# Patient Record
Sex: Female | Born: 1955 | Race: White | Hispanic: No | Smoking: Former smoker
Health system: Southern US, Community
[De-identification: ages and names within clinical notes are randomized; demographics above are authoritative.]

## PROBLEM LIST (undated history)

## (undated) DIAGNOSIS — M2550 Pain in unspecified joint: Secondary | ICD-10-CM

## (undated) DIAGNOSIS — M1990 Unspecified osteoarthritis, unspecified site: Secondary | ICD-10-CM

## (undated) DIAGNOSIS — T7840XA Allergy, unspecified, initial encounter: Secondary | ICD-10-CM

## (undated) HISTORY — DX: Pain in unspecified joint: M25.50

## (undated) HISTORY — DX: Unspecified osteoarthritis, unspecified site: M19.90

## (undated) HISTORY — PX: ECTOPIC PREGNANCY SURGERY: SHX613

## (undated) HISTORY — PX: TONSILLECTOMY: SUR1361

## (undated) HISTORY — DX: Allergy, unspecified, initial encounter: T78.40XA

---

## 1998-05-07 ENCOUNTER — Other Ambulatory Visit: Admission: RE | Admit: 1998-05-07 | Discharge: 1998-05-07 | Payer: Self-pay | Admitting: Family Medicine

## 1999-05-16 ENCOUNTER — Other Ambulatory Visit: Admission: RE | Admit: 1999-05-16 | Discharge: 1999-05-16 | Payer: Self-pay | Admitting: Family Medicine

## 2000-07-23 ENCOUNTER — Other Ambulatory Visit: Admission: RE | Admit: 2000-07-23 | Discharge: 2000-07-23 | Payer: Self-pay | Admitting: Family Medicine

## 2001-07-23 ENCOUNTER — Other Ambulatory Visit: Admission: RE | Admit: 2001-07-23 | Discharge: 2001-07-23 | Payer: Self-pay | Admitting: Family Medicine

## 2002-01-30 ENCOUNTER — Encounter: Admission: RE | Admit: 2002-01-30 | Discharge: 2002-01-30 | Payer: Self-pay | Admitting: Family Medicine

## 2002-01-30 ENCOUNTER — Encounter: Payer: Self-pay | Admitting: Family Medicine

## 2003-01-23 ENCOUNTER — Other Ambulatory Visit: Admission: RE | Admit: 2003-01-23 | Discharge: 2003-01-23 | Payer: Self-pay | Admitting: Family Medicine

## 2011-05-11 ENCOUNTER — Ambulatory Visit: Payer: Self-pay | Admitting: Physician Assistant

## 2011-05-11 VITALS — BP 130/78 | HR 85 | Temp 98.1°F | Resp 16 | Ht 65.78 in | Wt 169.0 lb

## 2011-05-11 DIAGNOSIS — S91309A Unspecified open wound, unspecified foot, initial encounter: Secondary | ICD-10-CM

## 2011-05-11 DIAGNOSIS — S91339A Puncture wound without foreign body, unspecified foot, initial encounter: Secondary | ICD-10-CM

## 2011-05-11 MED ORDER — CIPROFLOXACIN HCL 500 MG PO TABS: 500.0000 mg | ORAL_TABLET | Freq: Two times a day (BID) | ORAL | Status: DC

## 2011-05-11 MED ORDER — TETANUS-DIPHTH-ACELL PERTUSSIS 5-2.5-18.5 LF-MCG/0.5 IM SUSP: 0.5000 mL | Freq: Once | INTRAMUSCULAR | Status: DC

## 2011-05-11 NOTE — Progress Notes (Signed)
Patient ID: Ashlee Cochran MRN: [id number], DOB: December 23, 1955, 56 y.o. Date of Encounter: 05/11/2011, 5:49 PM  Primary Physician: No primary provider on file.  Chief Complaint: Puncture wound to right foot  HPI: 56 y.o. year old female present with puncture wound to the right plantar foot. Patient was taking down her current deck and went to kick a board loose. Upon doing do she inadvertently kicked where a nail was causing the nail to go through her tennis shoe and sock into the plantar surface of her foot. After her injury she washed the wound with soap and water. She examined the nail and saw it in total stating that a piece did not break off into her. She is uncertain of when her last tetanus vaccine was. Mild discomfort with ambulation.    No past medical history on file.   Home Meds: Prior to Admission medications   Medication Sig Start Date End Date Taking? Authorizing Provider           Allergies:  Allergies  Allergen Reactions  . Codeine Nausea And Vomiting    History   Social History  . Marital Status: Widowed    Spouse Name: N/A    Number of Children: N/A  . Years of Education: N/A   Occupational History  . Not on file.   Social History Main Topics  . Smoking status: Former Smoker    Quit date: 05/11/2002  . Smokeless tobacco: Not on file  . Alcohol Use: Not on file  . Drug Use: Not on file  . Sexually Active: Not on file   Other Topics Concern  . Not on file   Social History Narrative  . No narrative on file     Review of Systems: Constitutional: negative for chills, fever, night sweats, weight changes, or fatigue  HEENT: negative for vision changes, hearing loss, congestion, rhinorrhea, ST, epistaxis, or sinus pressure Cardiovascular: negative for chest pain or palpitations Respiratory: negative for hemoptysis, wheezing, shortness of breath, or cough Abdominal: negative for abdominal pain, nausea, vomiting, diarrhea, or constipation Dermatological:  negative for rash Neurologic: negative for headache, dizziness, or syncope All other systems reviewed and are otherwise negative with the exception to those above and in the HPI.   Physical Exam: Blood pressure 130/78, pulse 85, temperature 98.1 F (36.7 C), temperature source Oral, resp. rate 16, height 5' 5.78" (1.671 m), weight 169 lb (76.658 kg), last menstrual period 04/05/2011., Body mass index is 27.46 kg/(m^2). General: Well developed, well nourished, in no acute distress. Head: Normocephalic, atraumatic, eyes without discharge, sclera non-icteric, nares are without discharge.   Neck: Supple. No thyromegaly. Full ROM. No lymphadenopathy. Lungs: Clear bilaterally to auscultation without wheezes, rales, or rhonchi. Breathing is unlabored. Heart: RRR with S1 S2. No murmurs, rubs, or gallops appreciated. Msk:  Strength and tone normal for age. Extremities/Skin: Right plantar surface of the foot with small puncture wound to lateral aspect. Mild TTP. No erythema or STS. No FB visualized. No purulence expressed. Warm and dry. No clubbing or cyanosis. No edema. No rashes or suspicious lesions. Neuro: Alert and oriented X 3. Moves all extremities spontaneously. Gait is normal. CNII-XII grossly in tact. Psych:  Responds to questions appropriately with a normal affect.   Xrays: Declined by the patient  ASSESSMENT AND PLAN:  56 y.o. year old female with puncture wound to the plantar surface of the right foot -Washed with soap and water -Dressed -Cipro 500 mg #20 1 po bid no RF -TDaP -Xrays declined to  r/o possible FB. Patient informed RTC precautions   Signed, Eula Listen, PA-C 05/11/2011 5:49 PM

## 2011-05-12 ENCOUNTER — Other Ambulatory Visit: Payer: Self-pay | Admitting: *Deleted

## 2011-05-12 DIAGNOSIS — S91339A Puncture wound without foreign body, unspecified foot, initial encounter: Secondary | ICD-10-CM

## 2011-05-12 MED ORDER — CIPROFLOXACIN HCL 500 MG PO TABS: 500.0000 mg | ORAL_TABLET | Freq: Two times a day (BID) | ORAL | Status: AC

## 2012-12-01 ENCOUNTER — Ambulatory Visit: Payer: Managed Care, Other (non HMO)

## 2012-12-01 ENCOUNTER — Ambulatory Visit (INDEPENDENT_AMBULATORY_CARE_PROVIDER_SITE_OTHER): Payer: Managed Care, Other (non HMO) | Admitting: Internal Medicine

## 2012-12-01 VITALS — BP 116/72 | HR 69 | Temp 97.8°F | Resp 18 | Ht 67.5 in | Wt 170.0 lb

## 2012-12-01 DIAGNOSIS — M722 Plantar fascial fibromatosis: Secondary | ICD-10-CM

## 2012-12-01 DIAGNOSIS — M659 Synovitis and tenosynovitis, unspecified: Secondary | ICD-10-CM

## 2012-12-01 DIAGNOSIS — M25571 Pain in right ankle and joints of right foot: Secondary | ICD-10-CM

## 2012-12-01 DIAGNOSIS — M7750 Other enthesopathy of unspecified foot: Secondary | ICD-10-CM

## 2012-12-01 DIAGNOSIS — M25579 Pain in unspecified ankle and joints of unspecified foot: Secondary | ICD-10-CM

## 2012-12-01 MED ORDER — MELOXICAM 15 MG PO TABS: 15.0000 mg | ORAL_TABLET | Freq: Every day | ORAL | Status: DC

## 2012-12-01 NOTE — Patient Instructions (Signed)
Plantar Fasciitis (Heel Spur Syndrome)  with Rehab  The plantar fascia is a fibrous, ligament-like, soft-tissue structure that spans the bottom of the foot. Plantar fasciitis is a condition that causes pain in the foot due to inflammation of the tissue.  SYMPTOMS   · Pain and tenderness on the underneath side of the foot.  · Pain that worsens with standing or walking.  CAUSES   Plantar fasciitis is caused by irritation and injury to the plantar fascia on the underneath side of the foot. Common mechanisms of injury include:  · Direct trauma to bottom of the foot.  · Damage to a small nerve that runs under the foot where the main fascia attaches to the heel bone.  · Stress placed on the plantar fascia due to bone spurs.  RISK INCREASES WITH:   · Activities that place stress on the plantar fascia (running, jumping, pivoting, or cutting).  · Poor strength and flexibility.  · Improperly fitted shoes.  · Tight calf muscles.  · Flat feet.  · Failure to warm-up properly before activity.  · Obesity.  PREVENTION  · Warm up and stretch properly before activity.  · Allow for adequate recovery between workouts.  · Maintain physical fitness:  · Strength, flexibility, and endurance.  · Cardiovascular fitness.  · Maintain a health body weight.  · Avoid stress on the plantar fascia.  · Wear properly fitted shoes, including arch supports for individuals who have flat feet.  PROGNOSIS   If treated properly, then the symptoms of plantar fasciitis usually resolve without surgery. However, occasionally surgery is necessary.  RELATED COMPLICATIONS   · Recurrent symptoms that may result in a chronic condition.  · Problems of the lower back that are caused by compensating for the injury, such as limping.  · Pain or weakness of the foot during push-off following surgery.  · Chronic inflammation, scarring, and partial or complete fascia tear, occurring more often from repeated injections.  TREATMENT   Treatment initially involves the use of  ice and medication to help reduce pain and inflammation. The use of strengthening and stretching exercises may help reduce pain with activity, especially stretches of the Achilles tendon. These exercises may be performed at home or with a therapist. Your caregiver may recommend that you use heel cups of arch supports to help reduce stress on the plantar fascia. Occasionally, corticosteroid injections are given to reduce inflammation. If symptoms persist for greater than 6 months despite non-surgical (conservative), then surgery may be recommended.   MEDICATION   · If pain medication is necessary, then nonsteroidal anti-inflammatory medications, such as aspirin and ibuprofen, or other minor pain relievers, such as acetaminophen, are often recommended.  · Do not take pain medication within 7 days before surgery.  · Prescription pain relievers may be given if deemed necessary by your caregiver. Use only as directed and only as much as you need.  · Corticosteroid injections may be given by your caregiver. These injections should be reserved for the most serious cases, because they may only be given a certain number of times.  HEAT AND COLD  · Cold treatment (icing) relieves pain and reduces inflammation. Cold treatment should be applied for 10 to 15 minutes every 2 to 3 hours for inflammation and pain and immediately after any activity that aggravates your symptoms. Use ice packs or massage the area with a piece of ice (ice massage).  · Heat treatment may be used prior to performing the stretching and strengthening activities prescribed   by your caregiver, physical therapist, or athletic trainer. Use a heat pack or soak the injury in warm water.  SEEK IMMEDIATE MEDICAL CARE IF:  · Treatment seems to offer no benefit, or the condition worsens.  · Any medications produce adverse side effects.  EXERCISES  RANGE OF MOTION (ROM) AND STRETCHING EXERCISES - Plantar Fasciitis (Heel Spur Syndrome)  These exercises may help you  when beginning to rehabilitate your injury. Your symptoms may resolve with or without further involvement from your physician, physical therapist or athletic trainer. While completing these exercises, remember:   · Restoring tissue flexibility helps normal motion to return to the joints. This allows healthier, less painful movement and activity.  · An effective stretch should be held for at least 30 seconds.  · A stretch should never be painful. You should only feel a gentle lengthening or release in the stretched tissue.  RANGE OF MOTION - Toe Extension, Flexion  · Sit with your right / left leg crossed over your opposite knee.  · Grasp your toes and gently pull them back toward the top of your foot. You should feel a stretch on the bottom of your toes and/or foot.  · Hold this stretch for __________ seconds.  · Now, gently pull your toes toward the bottom of your foot. You should feel a stretch on the top of your toes and or foot.  · Hold this stretch for __________ seconds.  Repeat __________ times. Complete this stretch __________ times per day.   RANGE OF MOTION - Ankle Dorsiflexion, Active Assisted  · Remove shoes and sit on a chair that is preferably not on a carpeted surface.  · Place right / left foot under knee. Extend your opposite leg for support.  · Keeping your heel down, slide your right / left foot back toward the chair until you feel a stretch at your ankle or calf. If you do not feel a stretch, slide your bottom forward to the edge of the chair, while still keeping your heel down.  · Hold this stretch for __________ seconds.  Repeat __________ times. Complete this stretch __________ times per day.   STRETCH  Gastroc, Standing  · Place hands on wall.  · Extend right / left leg, keeping the front knee somewhat bent.  · Slightly point your toes inward on your back foot.  · Keeping your right / left heel on the floor and your knee straight, shift your weight toward the wall, not allowing your back to  arch.  · You should feel a gentle stretch in the right / left calf. Hold this position for __________ seconds.  Repeat __________ times. Complete this stretch __________ times per day.  STRETCH  Soleus, Standing  · Place hands on wall.  · Extend right / left leg, keeping the other knee somewhat bent.  · Slightly point your toes inward on your back foot.  · Keep your right / left heel on the floor, bend your back knee, and slightly shift your weight over the back leg so that you feel a gentle stretch deep in your back calf.  · Hold this position for __________ seconds.  Repeat __________ times. Complete this stretch __________ times per day.  STRETCH  Gastrocsoleus, Standing   Note: This exercise can place a lot of stress on your foot and ankle. Please complete this exercise only if specifically instructed by your caregiver.   · Place the ball of your right / left foot on a step, keeping   your other foot firmly on the same step.  · Hold on to the wall or a rail for balance.  · Slowly lift your other foot, allowing your body weight to press your heel down over the edge of the step.  · You should feel a stretch in your right / left calf.  · Hold this position for __________ seconds.  · Repeat this exercise with a slight bend in your right / left knee.  Repeat __________ times. Complete this stretch __________ times per day.   STRENGTHENING EXERCISES - Plantar Fasciitis (Heel Spur Syndrome)   These exercises may help you when beginning to rehabilitate your injury. They may resolve your symptoms with or without further involvement from your physician, physical therapist or athletic trainer. While completing these exercises, remember:   · Muscles can gain both the endurance and the strength needed for everyday activities through controlled exercises.  · Complete these exercises as instructed by your physician, physical therapist or athletic trainer. Progress the resistance and repetitions only as guided.  STRENGTH - Towel  Curls  · Sit in a chair positioned on a non-carpeted surface.  · Place your foot on a towel, keeping your heel on the floor.  · Pull the towel toward your heel by only curling your toes. Keep your heel on the floor.  · If instructed by your physician, physical therapist or athletic trainer, add ____________________ at the end of the towel.  Repeat __________ times. Complete this exercise __________ times per day.  STRENGTH - Ankle Inversion  · Secure one end of a rubber exercise band/tubing to a fixed object (table, pole). Loop the other end around your foot just before your toes.  · Place your fists between your knees. This will focus your strengthening at your ankle.  · Slowly, pull your big toe up and in, making sure the band/tubing is positioned to resist the entire motion.  · Hold this position for __________ seconds.  · Have your muscles resist the band/tubing as it slowly pulls your foot back to the starting position.  Repeat __________ times. Complete this exercises __________ times per day.   Document Released: 12/19/2004 Document Revised: 03/13/2011 Document Reviewed: 04/02/2008  [employer]® Patient Information ©2014 [employer].

## 2012-12-01 NOTE — Progress Notes (Addendum)
Subjective:    Patient ID: Ashlee Cochran, female    DOB: 1955/01/12, 57 y.o.   MRN: [id number]  This chart was scribed for Ashlee Sia, MD by Greggory Stallion, Medical Scribe. This patient's care was started at 11:10 AM.  HPI HPI Comments: Ashlee Cochran is a 57 y.o. female who presents to the office complaining of gradual onset, worsening right foot pain that started several months ago. The pain is mainly on the plantar aspect of her foot. Pt states there has also been some discoloration on her right ankle and foot. She states she might have injured it while kayaking over the summer. She states it was never swollen after the subjective injury. Pt states it is now starting to bother her at night and keeps her awake. Bearing weight can worsen the pain.  See prior nail wound R foot There are no active problems to display for this patient.  History reviewed. No pertinent past medical history. History reviewed. No pertinent past surgical history. Allergies  Allergen Reactions  . Codeine Nausea And Vomiting   Prior to Admission medications   Not on File   History   Social History  . Marital Status: Widowed    Spouse Name: N/A    Number of Children: N/A  . Years of Education: N/A   Occupational History  . Not on file.   Social History Main Topics  . Smoking status: Former Smoker    Quit date: 05/11/2002  . Smokeless tobacco: Not on file  . Alcohol Use: Not on file  . Drug Use: Not on file  . Sexual Activity: Not on file   Other Topics Concern  . Not on file   Social History Narrative  . No narrative on file    Review of Systems  Constitutional: Negative for fever.  Musculoskeletal: Positive for arthralgias and myalgias.  Skin: Positive for color change.       Objective:   Physical Exam  Nursing note and vitals reviewed. Constitutional: She is oriented to person, place, and time. She appears well-developed and well-nourished. No distress.  HENT:  Head:  Normocephalic and atraumatic.  Eyes: EOM are normal.  Neck: Neck supple.  Cardiovascular: Normal rate.   Pulmonary/Chest: Effort normal. No respiratory distress.  Musculoskeletal: Normal range of motion.  Left ankle has full ROM but there is ecchymosis medially behind medial malleolus without obvious swelling or crepitus. Tender at first M-T junction. Pain with eversion. Tender at the insertion of plantar fascia. Tendon nodule mid arch non tender. Calf is normal.   Neurological: She is alert and oriented to person, place, and time.  Skin: Skin is warm and dry.  Psychiatric: She has a normal mood and affect. Her behavior is normal.  [facility] reading (PRIMARY) by  Dr. Lucciano Vitali=negative ankle    Filed Vitals:   12/01/12 1022  BP: 116/72  Pulse: 69  Temp: 97.8 F (36.6 C)  TempSrc: Oral  Resp: 18  Height: 5' 7.5" (1.715 m)  Weight: 170 lb (77.111 kg)  SpO2: 100%      Assessment & Plan:  11:14 AM-Discussed treatment plan which includes ankle xray with pt at bedside and pt agreed to plan.   I have completed the patient encounter in its entirety as documented by the scribe, with editing by me where necessary. Taz Vanness P. Merla Riches, M.D.  Pain in joint, ankle and foot, right - Plan: DG Ankle Complete Right plantar fasc Tendonitis MT-T #1 Tendon nodule-plantar  melox plus exercises If not well 10mo  to orthe

## 2012-12-28 ENCOUNTER — Ambulatory Visit (INDEPENDENT_AMBULATORY_CARE_PROVIDER_SITE_OTHER): Payer: Managed Care, Other (non HMO) | Admitting: Internal Medicine

## 2012-12-28 ENCOUNTER — Ambulatory Visit: Payer: Self-pay

## 2012-12-28 ENCOUNTER — Ambulatory Visit: Payer: Managed Care, Other (non HMO)

## 2012-12-28 VITALS — BP 128/78 | HR 64 | Temp 98.0°F | Resp 16 | Ht 67.5 in | Wt 168.2 lb

## 2012-12-28 DIAGNOSIS — M25512 Pain in left shoulder: Secondary | ICD-10-CM

## 2012-12-28 DIAGNOSIS — W19XXXA Unspecified fall, initial encounter: Secondary | ICD-10-CM

## 2012-12-28 DIAGNOSIS — S42209A Unspecified fracture of upper end of unspecified humerus, initial encounter for closed fracture: Secondary | ICD-10-CM

## 2012-12-28 DIAGNOSIS — M25519 Pain in unspecified shoulder: Secondary | ICD-10-CM

## 2012-12-28 MED ORDER — HYDROCODONE-ACETAMINOPHEN 5-325 MG PO TABS: 1.0000 | ORAL_TABLET | Freq: Four times a day (QID) | ORAL | Status: DC | PRN

## 2012-12-28 NOTE — Progress Notes (Addendum)
Subjective:  This chart was scribed for Ashlee Sia, MD by Carl Best, Medical Scribe. This patient was seen in Room 10 and the patient's care was started at 4:34 PM.   Patient ID: Ashlee Cochran, female    DOB: [birth year], 57 y.o.   MRN: [id number]  HPI HPI Comments: Ashlee Cochran is a 57 y.o. female who presents to the [facility] complaining of constant left shoulder pain radiating to her left wrist, left elbow, and left ribs that started today after the patient fell while roller bladding.  She states that she fell onto some grass and on her left shoulder.  She states that she was wearing her helmet at the time of the incident but was not wearing wrist guards.  The patient states that her left ribs pain is aggravated when she takes a deep breath.  History reviewed. No pertinent past medical history. History reviewed. No pertinent past surgical history.. Family History  Problem Relation Age of Onset  . Cancer Mother   . Cancer Father    History   Social History  . Marital Status: Widowed    Spouse Name: N/A    Number of Children: N/A  . Years of Education: N/A   Occupational History  . Not on file.   Social History Main Topics  . Smoking status: Former Smoker    Quit date: 05/11/2002  . Smokeless tobacco: Not on file  . Alcohol Use: Not on file  . Drug Use: Not on file  . Sexual Activity: Not on file   Other Topics Concern  . Not on file   Social History Narrative  . No narrative on file   Allergies  Allergen Reactions  . Codeine Nausea And Vomiting      Review of Systems A complete 10 system review of systems was obtained and all systems are negative except as noted in the HPI and PMH.   Objective:  Physical Exam  Nursing note and vitals reviewed. Constitutional: She is oriented to person, place, and time. She appears well-developed and well-nourished. No distress.  HENT:  Head: Normocephalic and atraumatic.  Eyes:  Conjunctivae and EOM are normal. Pupils are equal, round, and reactive to light.  Neck: Neck supple.  Cardiovascular: Normal rate and regular rhythm.   Pulmonary/Chest: Effort normal and breath sounds normal. No respiratory distress. She has no wheezes. She exhibits tenderness.  Very tender over the ribs in the axillary line  Abdominal: She exhibits no distension. There is no tenderness.  Musculoskeletal:       Left shoulder: She exhibits no deformity.  Very tender over the upper portion of the humerus with pain with any range of motion of the shoulder Nontender over the clavicle and a.c. Joint Tender over the trapezius Tender over the deltoid Not very swollen/no ecchymoses No distal motor or sensory losses Neck exam is normal Thoracic spine normal  Left wrist and hand normal   Neurological: She is alert and oriented to person, place, and time. No cranial nerve deficit.  Psychiatric: She has a normal mood and affect. Her behavior is normal.    [facility] reading (PRIMARY) by  Dr.Caron Ode=?nondipl fx prox humerus  No evidence of rib fractures  No evidence of fractures around the shoulder    BP 128/78  Pulse 64  Temp(Src) 98 F (36.7 C) (Oral)  Resp 16  Ht 5' 7.5" (1.715 m)  Wt 168 lb 3.2 oz (76.295 kg)  BMI 25.94 kg/m2  SpO2 99%  Assessment & Plan:  Fall, initial encounter - Plan: DG Humerus Left, DG Shoulder Left, DG Chest 1 View  Pain in shoulder region, left - Plan: DG Shoulder Left, DG Chest 1 View  Closed fracture of unspecified part of upper end of humerus - Plan: AMB referral to orthopedics  She will remain in a sling and be followed by referral to [facility] at the first of the week Meds ordered this encounter  Medications  . cholecalciferol (VITAMIN D) 400 UNITS TABS tablet    Sig: Take 400 Units by mouth.  Marland Kitchen HYDROcodone-acetaminophen (NORCO) 5-325 MG per tablet    Sig: Take 1 tablet by mouth every 6 (six) hours as needed for moderate pain.     Dispense:  30 tablet    Refill:  0    I personally performed the services described in this documentation, which was scribed in my presence. The recorded information has been reviewed and is accurate.

## 2012-12-31 ENCOUNTER — Encounter: Payer: Self-pay | Admitting: Physician Assistant

## 2013-01-23 ENCOUNTER — Encounter: Payer: Managed Care, Other (non HMO) | Admitting: Physician Assistant

## 2013-02-27 ENCOUNTER — Encounter: Payer: Managed Care, Other (non HMO) | Admitting: Physician Assistant

## 2013-03-25 ENCOUNTER — Encounter: Payer: Managed Care, Other (non HMO) | Admitting: Physician Assistant

## 2013-04-17 ENCOUNTER — Encounter: Payer: Self-pay | Admitting: Physician Assistant

## 2013-04-17 ENCOUNTER — Ambulatory Visit (INDEPENDENT_AMBULATORY_CARE_PROVIDER_SITE_OTHER): Payer: Managed Care, Other (non HMO) | Admitting: Physician Assistant

## 2013-04-17 VITALS — BP 121/82 | HR 69 | Temp 97.8°F | Resp 16 | Ht 66.0 in | Wt 166.0 lb

## 2013-04-17 DIAGNOSIS — Z1211 Encounter for screening for malignant neoplasm of colon: Secondary | ICD-10-CM

## 2013-04-17 DIAGNOSIS — Z1159 Encounter for screening for other viral diseases: Secondary | ICD-10-CM

## 2013-04-17 DIAGNOSIS — Z0000 Encounter for general adult medical examination without abnormal findings: Secondary | ICD-10-CM

## 2013-04-17 DIAGNOSIS — R195 Other fecal abnormalities: Secondary | ICD-10-CM

## 2013-04-17 DIAGNOSIS — Z23 Encounter for immunization: Secondary | ICD-10-CM

## 2013-04-17 DIAGNOSIS — Z124 Encounter for screening for malignant neoplasm of cervix: Secondary | ICD-10-CM

## 2013-04-17 LAB — POCT URINALYSIS DIPSTICK
BILIRUBIN UA: NEGATIVE
GLUCOSE UA: NEGATIVE
Ketones, UA: NEGATIVE
NITRITE UA: NEGATIVE
Protein, UA: NEGATIVE
RBC UA: NEGATIVE
Spec Grav, UA: 1.015
Urobilinogen, UA: 0.2
pH, UA: 7.5

## 2013-04-17 LAB — COMPREHENSIVE METABOLIC PANEL
ALT: 22 U/L (ref 0–35)
AST: 20 U/L (ref 0–37)
Albumin: 4.7 g/dL (ref 3.5–5.2)
Alkaline Phosphatase: 66 U/L (ref 39–117)
BUN: 11 mg/dL (ref 6–23)
CALCIUM: 10 mg/dL (ref 8.4–10.5)
CHLORIDE: 104 meq/L (ref 96–112)
CO2: 30 mEq/L (ref 19–32)
CREATININE: 0.7 mg/dL (ref 0.50–1.10)
Glucose, Bld: 98 mg/dL (ref 70–99)
Potassium: 4.7 mEq/L (ref 3.5–5.3)
Sodium: 143 mEq/L (ref 135–145)
Total Bilirubin: 0.5 mg/dL (ref 0.2–1.2)
Total Protein: 7.4 g/dL (ref 6.0–8.3)

## 2013-04-17 LAB — CBC WITH DIFFERENTIAL/PLATELET
Basophils Absolute: 0.1 10*3/uL (ref 0.0–0.1)
Basophils Relative: 1 % (ref 0–1)
Eosinophils Absolute: 0.1 10*3/uL (ref 0.0–0.7)
Eosinophils Relative: 2 % (ref 0–5)
HEMATOCRIT: 45.9 % (ref 36.0–46.0)
HEMOGLOBIN: 16.1 g/dL — AB (ref 12.0–15.0)
LYMPHS ABS: 2.4 10*3/uL (ref 0.7–4.0)
LYMPHS PCT: 46 % (ref 12–46)
MCH: 31.3 pg (ref 26.0–34.0)
MCHC: 35.1 g/dL (ref 30.0–36.0)
MCV: 89.1 fL (ref 78.0–100.0)
MONO ABS: 0.4 10*3/uL (ref 0.1–1.0)
Monocytes Relative: 8 % (ref 3–12)
Neutro Abs: 2.3 10*3/uL (ref 1.7–7.7)
Neutrophils Relative %: 43 % (ref 43–77)
Platelets: 205 10*3/uL (ref 150–400)
RBC: 5.15 MIL/uL — ABNORMAL HIGH (ref 3.87–5.11)
RDW: 12.9 % (ref 11.5–15.5)
WBC: 5.3 10*3/uL (ref 4.0–10.5)

## 2013-04-17 LAB — TSH: TSH: 2.266 u[IU]/mL (ref 0.350–4.500)

## 2013-04-17 LAB — LIPID PANEL
Cholesterol: 274 mg/dL — ABNORMAL HIGH (ref 0–200)
HDL: 63 mg/dL (ref >39)
LDL Cholesterol: 183 mg/dL — ABNORMAL HIGH (ref 0–99)
Total CHOL/HDL Ratio: 4.3 Ratio
Triglycerides: 141 mg/dL (ref <150)
VLDL: 28 mg/dL (ref 0–40)

## 2013-04-17 LAB — POCT UA - MICROSCOPIC ONLY
Amorphous: POSITIVE
Casts, Ur, LPF, POC: NEGATIVE
Crystals, Ur, HPF, POC: NEGATIVE
Mucus, UA: POSITIVE
Yeast, UA: NEGATIVE

## 2013-04-17 LAB — IFOBT (OCCULT BLOOD): IFOBT: POSITIVE

## 2013-04-17 MED ORDER — ZOSTER VACCINE LIVE 19400 UNT/0.65ML ~~LOC~~ SOLR: 0.6500 mL | Freq: Once | SUBCUTANEOUS | Status: DC

## 2013-04-17 NOTE — Patient Instructions (Signed)
I will contact you with your lab results as soon as they are available.   If you have not heard from me in 2 weeks, please contact me.  The fastest way to get your results is to register for My Chart (see the instructions on the last page of this printout).  Keeping You Healthy  Get These Tests  Blood Pressure- Have your blood pressure checked by your healthcare provider at least once a year.  Normal blood pressure is 120/80.  Weight- Have your body mass index (BMI) calculated to screen for obesity.  BMI is a measure of body fat based on height and weight.  You can calculate your own BMI at www.nhlbisupport.com/bmi/  Cholesterol- Have your cholesterol checked every year.  Diabetes- Have your blood sugar checked every year if you have high blood pressure, high cholesterol, a family history of diabetes or if you are overweight.  Pap Smear- Have a pap smear every 3 to 5 years if you have been sexually active.  If you are older than 65 and recent pap smears have been normal you may not need additional pap smears.  In addition, if you have had a hysterectomy  For benign disease additional pap smears are not necessary.  Mammogram-Yearly mammograms are essential for early detection of breast cancer  Screening for Colon Cancer- Colonoscopy starting at age 50. Screening may begin sooner depending on your family history and other health conditions.  Follow up colonoscopy as directed by your Gastroenterologist.  Screening for Osteoporosis- Screening begins at age 65 with bone density scanning, sooner if you are at higher risk for developing Osteoporosis.  Get these medicines  Calcium with Vitamin D- Your body requires 1200-1500 mg of Calcium a day and 800-1000 IU of Vitamin D a day.  You can only absorb 500 mg of Calcium at a time therefore Calcium must be taken in 2 or 3 separate doses throughout the day.  Hormones- Hormone therapy has been associated with increased risk for certain cancers and  heart disease.  Talk to your healthcare provider about if you need relief from menopausal symptoms.  Aspirin- Ask your healthcare provider about taking Aspirin to prevent Heart Disease and Stroke.  Get these Immuniztions  Flu shot- Every fall  Pneumonia shot- Once after the age of 65; if you are younger ask your healthcare provider if you need a pneumonia shot.  Tetanus- Every ten years.  Zostavax- Once after the age of 60 to prevent shingles.  Take these steps  Don't smoke- Your healthcare provider can help you quit. For tips on how to quit, ask your healthcare provider or go to www.smokefree.gov or call 1-800 QUIT-NOW.  Be physically active- Exercise 5 days a week for a minimum of 30 minutes.  If you are not already physically active, start slow and gradually work up to 30 minutes of moderate physical activity.  Try walking, dancing, bike riding, swimming, etc.  Eat a healthy diet- Eat a variety of healthy foods such as fruits, vegetables, whole grains, low fat milk, low fat cheeses, yogurt, lean meats, chicken, fish, eggs, dried beans, tofu, etc.  For more information go to www.thenutritionsource.org  Dental visit- Brush and floss teeth twice daily; visit your dentist twice a year.  Eye exam- Visit your Optometrist or Ophthalmologist yearly.  Drink alcohol in moderation- Limit alcohol intake to one drink or less a day.  Never drink and drive.  Depression- Your emotional health is as important as your physical health.  If you're feeling   down or losing interest in things you normally enjoy, please talk to your healthcare provider.  Seat Belts- can save your life; always wear one  Smoke/Carbon Monoxide detectors- These detectors need to be installed on the appropriate level of your home.  Replace batteries at least once a year.  Violence- If anyone is threatening or hurting you, please tell your healthcare provider.  Living Will/ Health care power of attorney- Discuss with your  healthcare provider and family. 

## 2013-04-17 NOTE — Progress Notes (Signed)
Subjective:    Patient ID: Ashlee Cochran, female    DOB: 07-Mar-1955, 58 y.o.   MRN: [id number]   PCP: No PCP Per Patient  Chief Complaint  Patient presents with  . Annual Exam  . Gynecologic Exam    HPI    Review of Systems  Constitutional: Positive for diaphoresis (since menses stopped spring 2014; now less frequent, and only at night). Negative for fever, chills, activity change, appetite change, fatigue and unexpected weight change.  HENT: Negative.   Eyes: Negative.   Respiratory: Negative.   Cardiovascular: Negative.   Gastrointestinal: Negative.   Endocrine: Negative.   Genitourinary: Negative.   Musculoskeletal: Positive for arthralgias (RIGHT ankle/foot, plantar fasciitis on the LEFT; ibuprofen, stretching/exercising, regular shoe change; LEFT shoulder fx, frozen shoulder-in PT). Negative for back pain, myalgias and neck pain.  Skin: Negative.   Allergic/Immunologic: Positive for environmental allergies (pretty managable, earlier onset this spring than usual; no need for medications). Negative for food allergies and immunocompromised state.  Neurological: Negative.   Hematological: Negative.   Psychiatric/Behavioral: Negative.        Objective:   Physical Exam  Vitals reviewed. Constitutional: She is oriented to person, place, and time. Vital signs are normal. She appears well-developed and well-nourished. She is active and cooperative. No distress.  HENT:  Head: Normocephalic and atraumatic.  Right Ear: Hearing, tympanic membrane, external ear and ear canal normal. No foreign bodies.  Left Ear: Hearing, tympanic membrane, external ear and ear canal normal. No foreign bodies.  Nose: Nose normal.  Mouth/Throat: Uvula is midline, oropharynx is clear and moist and mucous membranes are normal. No oral lesions. Normal dentition. No dental abscesses or uvula swelling. No oropharyngeal exudate.  Eyes: Conjunctivae, EOM and lids are normal. Pupils are equal, round,  and reactive to light. Right eye exhibits no discharge. Left eye exhibits no discharge. No scleral icterus.  Fundoscopic exam:      The right eye shows no arteriolar narrowing, no AV nicking, no exudate, no hemorrhage and no papilledema.       The left eye shows no arteriolar narrowing, no AV nicking, no exudate, no hemorrhage and no papilledema.  Neck: Trachea normal, normal range of motion and full passive range of motion without pain. Neck supple. No spinous process tenderness and no muscular tenderness present. No mass and no thyromegaly present.  Cardiovascular: Normal rate, regular rhythm, normal heart sounds, intact distal pulses and normal pulses.   Pulmonary/Chest: Effort normal and breath sounds normal. She exhibits no tenderness and no retraction. Right breast exhibits no inverted nipple, no mass, no nipple discharge, no skin change and no tenderness. Left breast exhibits no inverted nipple, no mass, no nipple discharge, no skin change and no tenderness. Breasts are symmetrical.  Abdominal: Soft. Normal appearance and bowel sounds are normal. She exhibits no distension and no mass. There is no hepatosplenomegaly. There is no tenderness. There is no rigidity, no rebound, no guarding, no CVA tenderness, no tenderness at McBurney's point and negative Murphy's sign. No hernia. Hernia confirmed negative in the right inguinal area and confirmed negative in the left inguinal area.  Genitourinary: Rectum normal, vagina normal and uterus normal. Rectal exam shows no external hemorrhoid and no fissure. No breast swelling, tenderness, discharge or bleeding. Pelvic exam was performed with patient supine. No labial fusion. There is no rash, tenderness, lesion or injury on the right labia. There is no rash, tenderness, lesion or injury on the left labia. Cervix exhibits no motion tenderness, no  discharge and no friability. Right adnexum displays no mass, no tenderness and no fullness. Left adnexum displays no  mass, no tenderness and no fullness. No erythema, tenderness or bleeding around the vagina. No foreign body around the vagina. No signs of injury around the vagina. No vaginal discharge found.  Musculoskeletal: She exhibits no edema and no tenderness.       Right shoulder: She exhibits normal range of motion.       Left shoulder: She exhibits decreased range of motion.       Right elbow: Normal.      Left elbow: Normal.       Right wrist: Normal.       Left wrist: Normal.       Right hip: Normal.       Left hip: Normal.       Right knee: Normal.       Left knee: Normal.       Right ankle: Normal.       Left ankle: Normal.       Cervical back: Normal.       Thoracic back: Normal.       Lumbar back: Normal.       Right hand: Normal.       Left hand: Normal.  Lymphadenopathy:       Head (right side): No tonsillar, no preauricular, no posterior auricular and no occipital adenopathy present.       Head (left side): No tonsillar, no preauricular, no posterior auricular and no occipital adenopathy present.    She has no cervical adenopathy.    She has no axillary adenopathy.       Right: No inguinal and no supraclavicular adenopathy present.       Left: No inguinal and no supraclavicular adenopathy present.  Neurological: She is alert and oriented to person, place, and time. She has normal strength and normal reflexes. No cranial nerve deficit. She exhibits normal muscle tone. Coordination and gait normal.  Skin: Skin is warm, dry and intact. No rash noted. She is not diaphoretic. No cyanosis or erythema. Nails show no clubbing.  Psychiatric: She has a normal mood and affect. Her speech is normal and behavior is normal. Judgment and thought content normal.      Results for orders placed in visit on 04/17/13  POCT URINALYSIS DIPSTICK      Result Value Ref Range   Color, UA yellow     Clarity, UA cloudy     Glucose, UA neg     Bilirubin, UA neg     Ketones, UA neg     Spec Grav, UA  1.015     Blood, UA neg     pH, UA 7.5     Protein, UA neg     Urobilinogen, UA 0.2     Nitrite, UA neg     Leukocytes, UA small (1+)    POCT UA - MICROSCOPIC ONLY      Result Value Ref Range   WBC, Ur, HPF, POC 6-7     RBC, urine, microscopic 1-2     Bacteria, U Microscopic 3+     Mucus, UA pos     Epithelial cells, urine per micros 1-2     Crystals, Ur, HPF, POC neg     Casts, Ur, LPF, POC neg     Yeast, UA neg     Amorphous pos    IFOBT (OCCULT BLOOD)      Result Value Ref Range  IFOBT Positive         Assessment & Plan:  1. Annual physical exam Age appropriate anticipatory guidance provided. Last mammogram was 2011.  Does not want another now.  Discussed at length.  Will recommend again at next visit. - POCT urinalysis dipstick - POCT UA - Microscopic Only - CBC with Differential - Comprehensive metabolic panel - Lipid panel - TSH - IFOBT POC (occult bld, rslt in office)  2. Need for hepatitis C screening test - Hepatitis C antibody  3. Need for shingles vaccine - zoster vaccine live, PF, (ZOSTAVAX) 16109 UNT/0.65ML injection; Inject 19,400 Units into the skin once.  Dispense: 0.65 mL; Refill: 0  4. Screening for colon cancer Not sure she is willing to have a colonoscopy. Refer to discuss with GI. - Ambulatory referral to Gastroenterology  5. Screening for cervical cancer If pap cytology and HPV test are both negative, repeat both in 5 years. - Pap IG and HPV (high risk) DNA detection  6. Heme positive stool Refer to GI, as above.  Discussed possibility of DRE causing the bleeding, but colonoscopy still recommended.   Fernande Bras, PA-C Physician Assistant-Certified [facility]

## 2013-04-18 ENCOUNTER — Encounter: Payer: Self-pay | Admitting: Internal Medicine

## 2013-04-18 LAB — PAP IG AND HPV HIGH-RISK: HPV DNA HIGH RISK: NOT DETECTED

## 2013-04-18 LAB — HEPATITIS C ANTIBODY: HCV AB: NEGATIVE

## 2013-04-21 ENCOUNTER — Encounter: Payer: Self-pay | Admitting: Physician Assistant

## 2013-06-23 ENCOUNTER — Ambulatory Visit: Payer: Self-pay | Admitting: Internal Medicine

## 2013-07-02 ENCOUNTER — Ambulatory Visit: Payer: Managed Care, Other (non HMO)

## 2013-07-02 ENCOUNTER — Ambulatory Visit (INDEPENDENT_AMBULATORY_CARE_PROVIDER_SITE_OTHER): Payer: Managed Care, Other (non HMO) | Admitting: Family Medicine

## 2013-07-02 ENCOUNTER — Ambulatory Visit (INDEPENDENT_AMBULATORY_CARE_PROVIDER_SITE_OTHER): Payer: Managed Care, Other (non HMO)

## 2013-07-02 VITALS — BP 118/76 | HR 75 | Temp 97.9°F | Resp 18 | Ht 66.5 in | Wt 165.6 lb

## 2013-07-02 DIAGNOSIS — W540XXA Bitten by dog, initial encounter: Secondary | ICD-10-CM

## 2013-07-02 DIAGNOSIS — T148XXA Other injury of unspecified body region, initial encounter: Secondary | ICD-10-CM

## 2013-07-02 MED ORDER — AMOXICILLIN-POT CLAVULANATE 875-125 MG PO TABS: 1.0000 | ORAL_TABLET | Freq: Two times a day (BID) | ORAL | Status: DC

## 2013-07-02 NOTE — Patient Instructions (Addendum)
Use the augmentin antibiotic as directed.  Keep a close eye out for any sign of infection such as heat, swelling, pus or worsening pain.  Try ice to help with pain and swelling.   Animal Control will follow-up to ensure that the dog is up to date on all vaccines   WOUND CARE Please return in 7-10 days to have your stitches/staples removed or sooner if you have concerns. Marland Kitchen. Keep area clean and dry for 24 hours. Do not remove bandage, if applied. . After 24 hours, remove bandage and wash wound gently with mild soap and warm water. Reapply a new bandage after cleaning wound, if directed. . Continue daily cleansing with soap and water until stitches/staples are removed. . Do not apply any ointments or creams to the wound while stitches/staples are in place, as this may cause delayed healing. . Notify the office if you experience any of the following signs of infection: Swelling, redness, pus drainage, streaking, fever >101.0 F . Notify the office if you experience excessive bleeding that does not stop after 15-20 minutes of constant, firm pressure.

## 2013-07-02 NOTE — Progress Notes (Signed)
Procedure Note: Verbal consent obtained from the patient.  Local anesthesia with 2 cc Lidocaine 2% without epinephrine.  Wound scrubbed with soap and water.  Wound explored.  No foreign bodies or deep structure injury noted.  Wound closed with #3 simple interrupted sutures of 5-0 ethilon.  Area cleansed and dressed.  Pt tolerated very well.  Discussed wound care.  Anticipate suture removal in 7-10 days

## 2013-07-02 NOTE — Progress Notes (Signed)
[facility] [address], [place] [id number] [phone]  Date:  07/02/2013   Name:  Ashlee EarthlyCatherine B Cochran   DOB:  December 09, 1955   MRN:  [id number]  PCP:  No PCP Per Patient    Chief Complaint: Animal Bite   History of Present Illness:  Ashlee EarthlyCatherine B Cochran is a 58 y.o. very pleasant female patient who presents with the following:  She is dogsitting for her neighbor's dog.  Last night there was a strong storm and the dog panicked- the dog bit her on both hands while she was trying to catch him.  This occurred at around 9:45 pm, pt seen at 9am The animal is UTD on his vaccines and she completed a bite report.      Her last tetanus was 2 years ago; we have documentation.   She is generally in good health.    She notes pain and swelling of the lateral right hand, multiple small puncture wounds and two larger lacerations, one on each hand.   There are no active problems to display for this patient.   Past Medical History  Diagnosis Date  . Allergy   . Arthralgia     Past Surgical History  Procedure Laterality Date  . Ectopic pregnancy surgery    . Tonsillectomy      History  Substance Use Topics  . Smoking status: Former Smoker    Quit date: 05/11/2002  . Smokeless tobacco: Never Used  . Alcohol Use: No    Family History  Problem Relation Age of Onset  . Cancer Mother     mouth (+ tobacco)  . Cancer Father     laryngeal (+tobacco)    Allergies  Allergen Reactions  . Codeine Nausea And Vomiting    Medication list has been reviewed and updated.  Current Outpatient Prescriptions on File Prior to Visit  Medication Sig Dispense Refill  . cholecalciferol (VITAMIN D) 400 UNITS TABS tablet Take 400 Units by mouth.      . zoster vaccine live, PF, (ZOSTAVAX) [id number] UNT/0.65ML injection Inject 19,400 Units into the skin once.  0.65 mL  0   No current facility-administered medications on file prior to visit.    Review of Systems:  As per HPI-  otherwise negative. She is otherwise unhurt  Physical Examination: Filed Vitals:   07/02/13 0848  BP: 118/76  Pulse: 75  Temp: 97.9 F (36.6 C)  Resp: 18   Filed Vitals:   07/02/13 0848  Height: 5' 6.5" (1.689 m)  Weight: 165 lb 9.6 oz (75.116 kg)   Body mass index is 26.33 kg/(m^2). Ideal Body Weight: Weight in (lb) to have BMI = 25: 156.9  GEN: WDWN, NAD, Non-toxic, A & O x 3, looks well HEENT: Atraumatic, Normocephalic. Neck supple. No masses, No LAD. Ears and Nose: No external deformity. CV: RRR, No M/G/R. No JVD. No thrill. No extra heart sounds. PULM: CTA B, no wheezes, crackles, rhonchi. No retractions. No resp. distress. No accessory muscle use. EXTR: No c/c/e NEURO Normal gait.  PSYCH: Normally interactive. Conversant. Not depressed or anxious appearing.  Calm demeanor.  Right hand:there is a small but deep puncture wound on the dorsal hand over the 4th/5th MT, and a narrow, 1cm laceration over the 2nd MCP Left hand:  There is an approx 1cm, gaping wound in the web space between the index and long fingers.  Hands show normal strength, ROM, perfusion and sensation.  No evidence of deeper structure damage.  [facility] reading (PRIMARY) by  Dr. Patsy Lageropland. Right hand:negative for fracture Left hand: negative for fracture  LEFT HAND - COMPLETE 3+ VIEW  COMPARISON: None.  FINDINGS: There is no evidence of fracture or dislocation. There is no evidence of arthropathy or other focal bone abnormality. Soft tissues are unremarkable.  IMPRESSION: No evidence for fracture. No evidence for retained radiopaque soft tissue foreign body.  RIGHT HAND - COMPLETE 3+ VIEW  COMPARISON: None  FINDINGS: Small ossicle at dorsal margin, base of distal phalanx RIGHT middle finger, appears corticated and old.  Osseous mineralization normal.  Joint spaces preserved.  No acute fracture, dislocation or bone destruction.  No definite radiopaque foreign body or soft tissue gas  identified.  IMPRESSION: No acute abnormalities.  Discussed with pt in detail.  She has done a bite report, and should hear from animal control.  There is no evidence of fracture.  Due to multiple bites, one of which is already tender and a bit swollen will cover with augmentin.  Primary closure can increase risk of infection.  However the wound on her left hand is gaping and will likely heal better with suture.  She would like to have this repaired.    Assessment and Plan: Dog bite - Plan: DG Hand Complete Right, DG Hand Complete Left, amoxicillin-clavulanate (AUGMENTIN) 875-125 MG per tablet  Repaired most serious laceration with sutures as per note by Frances FurbishElizabeth Egan, PA-C.  Other wounds were washed thoroughly and closed with steri-strips as needed.  Close follow-up if any evidence of infection, otherwise SR in 7- 10 days   Meds ordered this encounter  Medications  . amoxicillin-clavulanate (AUGMENTIN) 875-125 MG per tablet    Sig: Take 1 tablet by mouth 2 (two) times daily.    Dispense:  20 tablet    Refill:  0    Signed Abbe AmsterdamJessica Copland, MD

## 2013-07-10 ENCOUNTER — Ambulatory Visit (INDEPENDENT_AMBULATORY_CARE_PROVIDER_SITE_OTHER): Payer: Managed Care, Other (non HMO) | Admitting: Physician Assistant

## 2013-07-10 VITALS — BP 124/68 | HR 70 | Temp 97.9°F | Resp 16 | Ht 66.5 in | Wt 169.0 lb

## 2013-07-10 DIAGNOSIS — Z5189 Encounter for other specified aftercare: Secondary | ICD-10-CM

## 2013-07-10 DIAGNOSIS — S61402D Unspecified open wound of left hand, subsequent encounter: Secondary | ICD-10-CM

## 2013-07-10 DIAGNOSIS — S61409A Unspecified open wound of unspecified hand, initial encounter: Secondary | ICD-10-CM

## 2013-07-10 NOTE — Progress Notes (Signed)
   Subjective:    Patient ID: Ashlee Cochran, female    DOB: 1955/08/05, 58 y.o.   MRN: [id number]  HPI 58 year old female presents for suture removal. DOI 07/02/13 after a dog bite.  Placed on Augmentin which she is tolerating well.  Denies any erythema, warmth, or drainage. Her main concern is clearance to get into the pool.   Patient is otherwise doing well with no other concerns today.    Review of Systems  Constitutional: Negative for fever and chills.  Gastrointestinal: Negative for nausea and vomiting.  Skin: Positive for wound. Negative for color change.       Objective:   Physical Exam  Constitutional: She is oriented to person, place, and time. She appears well-developed and well-nourished.  HENT:  Head: Normocephalic and atraumatic.  Right Ear: External ear normal.  Left Ear: External ear normal.  Eyes: Conjunctivae are normal.  Neck: Normal range of motion.  Cardiovascular: Normal rate.   Pulmonary/Chest: Effort normal.  Neurological: She is alert and oriented to person, place, and time.  Skin:  Wound on left 3rd MCP well healing with sutures in place. No erythema, warmth, or drainage. Overlying scab in place.   Psychiatric: She has a normal mood and affect. Her behavior is normal. Judgment and thought content normal.     Sutures removed without difficulty. Patient tolerated well.      Assessment & Plan:  Open wound of hand, left, subsequent encounter  Sutures removed  Wound well healing Cleared to go into pool Follow up as needed.

## 2013-07-10 NOTE — Progress Notes (Signed)
   Subjective:    Patient ID: Ashlee Cochran, female    DOB: 01/12/1955, 58 y.o.   MRN: [id number]  HPI    Review of Systems     Objective:   Physical Exam        Assessment & Plan:

## 2013-12-03 ENCOUNTER — Encounter: Payer: Self-pay | Admitting: Family Medicine

## 2013-12-03 ENCOUNTER — Ambulatory Visit (INDEPENDENT_AMBULATORY_CARE_PROVIDER_SITE_OTHER): Payer: Managed Care, Other (non HMO) | Admitting: Family Medicine

## 2013-12-03 VITALS — BP 120/68 | HR 68 | Temp 97.8°F | Resp 16 | Ht 66.5 in | Wt 171.2 lb

## 2013-12-03 DIAGNOSIS — M2550 Pain in unspecified joint: Secondary | ICD-10-CM

## 2013-12-03 NOTE — Progress Notes (Addendum)
Subjective:    Patient ID: Ashlee Cochran, female    DOB: October 29, 1955, 58 y.o.   MRN: [id number]  HPI  This is a 58 year old female with no significant PMH presenting with polyarthralgia and myalgia for 1 month. She reports it began with the right great toe MTP, lateral knee, right shoulder, right bicep, right thumb MCP, and right index and middle DIP. She has had some bilateral scapular pain that has resolved. Today she woke with medial left knee pain. She reports the pain is worse in the mornings when she first gets up and improves through out the day. Moving/stretching makes the pain better. She has not noticed any considerable joint swelling. She has tried ibuprofen which helps some. She cannot recall history of a recent tick bite. She does not have a history of gout. She has no personal or family history of rheumatologic disease. [place] bit by mosquitos - she later found out some of the mosquitos at that beach tested positive for [place] virus. She denies fever, malaise. She endorses feeling chilled sometimes. [place] with her daughter and wants to feel better for this.  1 year ago she broke her left humerus and later developed frozen shoulder. She attended PT and gained full ROM back. Prior to one year ago, never had joint pain.  Review of Systems  Constitutional: Negative for fever.  Gastrointestinal: Negative for nausea, vomiting and abdominal pain.  Musculoskeletal: Positive for myalgias and arthralgias. Negative for back pain and neck pain.  Skin: Negative for color change and rash.  Neurological: Negative for headaches.  There are no active problems to display for this patient.  Prior to Admission medications   Medication Sig Start Date End Date Taking? Authorizing Provider  cholecalciferol (VITAMIN D) 400 UNITS TABS tablet Take 400 Units by mouth.   Yes Historical Provider, MD   Allergies    Allergen Reactions  . Codeine Nausea And Vomiting   Patient's social and family history were reviewed.     Objective:   Physical Exam  Constitutional: She is oriented to person, place, and time. She appears well-developed and well-nourished. No distress.  HENT:  Head: Normocephalic and atraumatic.  Right Ear: Hearing normal.  Left Ear: Hearing normal.  Nose: Nose normal.  Eyes: Conjunctivae and lids are normal. Right eye exhibits no discharge. Left eye exhibits no discharge. No scleral icterus.  Cardiovascular: Normal rate, regular rhythm, normal heart sounds, intact distal pulses and normal pulses.   Pulmonary/Chest: Effort normal. No respiratory distress. She has no wheezes. She has no rhonchi. She has no rales.  Musculoskeletal: Normal range of motion.       Right shoulder: She exhibits tenderness (slight, anterior shoulder). She exhibits normal range of motion, no effusion and no crepitus.       Left shoulder: She exhibits tenderness (anterior shoulder). She exhibits normal range of motion and no swelling.       Right elbow: Normal.She exhibits normal range of motion and no swelling. No tenderness found.       Left elbow: Normal.       Right wrist: Normal. She exhibits normal range of motion and no tenderness.       Left wrist: Normal.       Right knee: She exhibits MCL laxity. She exhibits normal range of motion, no swelling, no erythema, no LCL laxity, normal patellar  mobility and normal meniscus. Tenderness (posterior/lateral knee, bony surface) found. No medial joint line, no lateral joint line and no patellar tendon tenderness noted.       Left knee: She exhibits normal range of motion, no swelling, no erythema, no LCL laxity, normal meniscus and no MCL laxity. Tenderness (medial knee) found. No medial joint line, no lateral joint line and no patellar tendon tenderness noted.       Right ankle: Normal.       Left ankle: Normal.       Cervical back: Normal.       Right upper  arm: She exhibits tenderness (biceps muscle and distal biceps tendon).       Right hand: She exhibits tenderness (index and middle finger DIPs, thumb MCP). She exhibits no swelling. Normal strength noted.       Left hand: Normal.       Right foot: There is tenderness (right great toe MCP) and deformity (1 cm nodule on plantar foot, nontender). There is normal range of motion and no swelling.       Left foot: Normal.  Neurological: She is alert and oriented to person, place, and time. She has normal strength. No sensory deficit.  Skin: Skin is warm, dry and intact. No rash noted.  Psychiatric: She has a normal mood and affect. Her speech is normal and behavior is normal. Thought content normal.   BP 120/68 mmHg  Pulse 68  Temp(Src) 97.8 F (36.6 C) (Oral)  Resp 16  Ht 5' 6.5" (1.689 m)  Wt 171 lb 3.2 oz (77.656 kg)  BMI 27.22 kg/m2  SpO2 97%  LMP 02/15/2013     Assessment & Plan:  1. Polyarthralgia Will do a rheumatologic workup and refer to rheumatology. Other labs below pending.  - CBC with Differential - Rheumatoid factor - ANA - Sedimentation rate - B. burgdorfi antibodies - Comprehensive metabolic panel - TSH - CK - Ambulatory referral to Rheumatology   Roswell MinersNicole V. Dyke BrackettBush, PA-C, MHS [facility]  12/03/2013

## 2013-12-03 NOTE — Progress Notes (Deleted)
Subjective:    Patient ID: Ashlee Cochran, female    DOB: [birth year], 58 y.o.   MRN: [id number]  12/03/2013  Arthritis   HPI This 58 y.o. female presents for evaluation of polyarthralgias for past month.  No history of fever/chills.  Complaining of R shoulder pain, R knee pain, R first toe 2   Review of Systems  Past Medical History  Diagnosis Date  . Allergy   . Arthralgia    Past Surgical History  Procedure Laterality Date  . Ectopic pregnancy surgery    . Tonsillectomy     Allergies  Allergen Reactions  . Codeine Nausea And Vomiting   Current Outpatient Prescriptions  Medication Sig Dispense Refill  . cholecalciferol (VITAMIN D) 400 UNITS TABS tablet Take 400 Units by mouth.    . zoster vaccine live, PF, (ZOSTAVAX) 78295 UNT/0.65ML injection Inject 19,400 Units into the skin once. 0.65 mL 0   No current facility-administered medications for this visit.       Objective:    BP 120/68 mmHg  Pulse 68  Temp(Src) 97.8 F (36.6 C) (Oral)  Resp 16  Ht 5' 6.5" (1.689 m)  Wt 171 lb 3.2 oz (77.656 kg)  BMI 27.22 kg/m2  SpO2 97%  LMP 02/15/2013 Physical Exam Results for orders placed or performed in visit on 04/17/13  CBC with Differential  Result Value Ref Range   WBC 5.3 4.0 - 10.5 K/uL   RBC 5.15 (H) 3.87 - 5.11 MIL/uL   Hemoglobin 16.1 (H) 12.0 - 15.0 g/dL   HCT 45.9 36.0 - 46.0 %   MCV 89.1 78.0 - 100.0 fL   MCH 31.3 26.0 - 34.0 pg   MCHC 35.1 30.0 - 36.0 g/dL   RDW 12.9 11.5 - 15.5 %   Platelets 205 150 - 400 K/uL   Neutrophils Relative % 43 43 - 77 %   Neutro Abs 2.3 1.7 - 7.7 K/uL   Lymphocytes Relative 46 12 - 46 %   Lymphs Abs 2.4 0.7 - 4.0 K/uL   Monocytes Relative 8 3 - 12 %   Monocytes Absolute 0.4 0.1 - 1.0 K/uL   Eosinophils Relative 2 0 - 5 %   Eosinophils Absolute 0.1 0.0 - 0.7 K/uL   Basophils Relative 1 0 - 1 %   Basophils Absolute 0.1 0.0 - 0.1 K/uL   Smear Review Criteria for review not met   Comprehensive metabolic panel    Result Value Ref Range   Sodium 143 135 - 145 mEq/L   Potassium 4.7 3.5 - 5.3 mEq/L   Chloride 104 96 - 112 mEq/L   CO2 30 19 - 32 mEq/L   Glucose, Bld 98 70 - 99 mg/dL   BUN 11 6 - 23 mg/dL   Creat 0.70 0.50 - 1.10 mg/dL   Total Bilirubin 0.5 0.2 - 1.2 mg/dL   Alkaline Phosphatase 66 39 - 117 U/L   AST 20 0 - 37 U/L   ALT 22 0 - 35 U/L   Total Protein 7.4 6.0 - 8.3 g/dL   Albumin 4.7 3.5 - 5.2 g/dL   Calcium 10.0 8.4 - 10.5 mg/dL  Hepatitis C antibody  Result Value Ref Range   HCV Ab NEGATIVE NEGATIVE  Lipid panel  Result Value Ref Range   Cholesterol 274 (H) 0 - 200 mg/dL   Triglycerides 141 <150 mg/dL   HDL 63 >39 mg/dL   Total CHOL/HDL Ratio 4.3 Ratio   VLDL 28 0 - 40 mg/dL  LDL Cholesterol 183 (H) 0 - 99 mg/dL  TSH  Result Value Ref Range   TSH 2.266 0.350 - 4.500 uIU/mL  POCT urinalysis dipstick  Result Value Ref Range   Color, UA yellow    Clarity, UA cloudy    Glucose, UA neg    Bilirubin, UA neg    Ketones, UA neg    Spec Grav, UA 1.015    Blood, UA neg    pH, UA 7.5    Protein, UA neg    Urobilinogen, UA 0.2    Nitrite, UA neg    Leukocytes, UA small (1+)   POCT UA - Microscopic Only  Result Value Ref Range   WBC, Ur, HPF, POC 6-7    RBC, urine, microscopic 1-2    Bacteria, U Microscopic 3+    Mucus, UA pos    Epithelial cells, urine per micros 1-2    Crystals, Ur, HPF, POC neg    Casts, Ur, LPF, POC neg    Yeast, UA neg    Amorphous pos   IFOBT POC (occult bld, rslt in office)  Result Value Ref Range   IFOBT Positive   Pap IG and HPV (high risk) DNA detection  Result Value Ref Range   HPV DNA High Risk Not Detected    Specimen adequacy: SEE NOTE    FINAL DIAGNOSIS: SEE NOTE    COMMENTS: SEE NOTE    Cytotechnologist: SEE NOTE        Assessment & Plan:  No diagnosis found.  No orders of the defined types were placed in this encounter.    No Follow-up on file.    Reginia Forts, M.D.  [facility] [address] [place], [place]  [address] [phone] phone [phone] fax

## 2013-12-03 NOTE — Patient Instructions (Signed)

## 2013-12-04 LAB — CBC WITH DIFFERENTIAL/PLATELET
Basophils Absolute: 0.1 10*3/uL (ref 0.0–0.1)
Basophils Relative: 1 % (ref 0–1)
EOS ABS: 0.1 10*3/uL (ref 0.0–0.7)
EOS PCT: 1 % (ref 0–5)
HCT: 44.7 % (ref 36.0–46.0)
HEMOGLOBIN: 15.3 g/dL — AB (ref 12.0–15.0)
LYMPHS ABS: 2.1 10*3/uL (ref 0.7–4.0)
Lymphocytes Relative: 28 % (ref 12–46)
MCH: 31.2 pg (ref 26.0–34.0)
MCHC: 34.2 g/dL (ref 30.0–36.0)
MCV: 91 fL (ref 78.0–100.0)
MPV: 10.6 fL (ref 9.4–12.4)
Monocytes Absolute: 0.5 10*3/uL (ref 0.1–1.0)
Monocytes Relative: 7 % (ref 3–12)
Neutro Abs: 4.8 10*3/uL (ref 1.7–7.7)
Neutrophils Relative %: 63 % (ref 43–77)
Platelets: 253 10*3/uL (ref 150–400)
RBC: 4.91 MIL/uL (ref 3.87–5.11)
RDW: 12.8 % (ref 11.5–15.5)
WBC: 7.6 10*3/uL (ref 4.0–10.5)

## 2013-12-04 LAB — SEDIMENTATION RATE: SED RATE: 7 mm/h (ref 0–22)

## 2013-12-04 LAB — COMPREHENSIVE METABOLIC PANEL
ALK PHOS: 63 U/L (ref 39–117)
ALT: 22 U/L (ref 0–35)
AST: 18 U/L (ref 0–37)
Albumin: 4.4 g/dL (ref 3.5–5.2)
BILIRUBIN TOTAL: 0.4 mg/dL (ref 0.2–1.2)
BUN: 17 mg/dL (ref 6–23)
CO2: 28 mEq/L (ref 19–32)
Calcium: 9.7 mg/dL (ref 8.4–10.5)
Chloride: 102 mEq/L (ref 96–112)
Creat: 0.82 mg/dL (ref 0.50–1.10)
Glucose, Bld: 95 mg/dL (ref 70–99)
Potassium: 4.2 mEq/L (ref 3.5–5.3)
Sodium: 139 mEq/L (ref 135–145)
Total Protein: 7.1 g/dL (ref 6.0–8.3)

## 2013-12-04 LAB — TSH: TSH: 0.905 u[IU]/mL (ref 0.350–4.500)

## 2013-12-04 LAB — B. BURGDORFI ANTIBODIES: B BURGDORFERI AB IGG+ IGM: 0.74 {ISR}

## 2013-12-04 LAB — ANA: Anti Nuclear Antibody(ANA): NEGATIVE

## 2013-12-04 LAB — RHEUMATOID FACTOR: Rhuematoid fact SerPl-aCnc: <10 IU/mL (ref <=14)

## 2013-12-04 LAB — CK: Total CK: 56 U/L (ref 7–177)

## 2014-01-26 NOTE — Progress Notes (Signed)
History and physical examinations obtained with Lanier ClamNicole Bush, PA-C. Agree with assessment and plan as outlined.

## 2014-04-13 ENCOUNTER — Ambulatory Visit (INDEPENDENT_AMBULATORY_CARE_PROVIDER_SITE_OTHER): Payer: Managed Care, Other (non HMO) | Admitting: Family Medicine

## 2014-04-13 ENCOUNTER — Encounter: Payer: Self-pay | Admitting: Family Medicine

## 2014-04-13 VITALS — BP 132/83 | HR 65 | Temp 97.6°F | Resp 16 | Ht 66.5 in | Wt 174.0 lb

## 2014-04-13 DIAGNOSIS — M2550 Pain in unspecified joint: Secondary | ICD-10-CM

## 2014-04-13 DIAGNOSIS — F411 Generalized anxiety disorder: Secondary | ICD-10-CM

## 2014-04-13 MED ORDER — MELOXICAM 15 MG PO TABS: 15.0000 mg | ORAL_TABLET | Freq: Every day | ORAL | Status: AC

## 2014-04-13 MED ORDER — TRAMADOL HCL 50 MG PO TABS: 50.0000 mg | ORAL_TABLET | Freq: Four times a day (QID) | ORAL | Status: DC | PRN

## 2014-04-13 MED ORDER — DULOXETINE HCL 30 MG PO CPEP: 30.0000 mg | ORAL_CAPSULE | Freq: Every day | ORAL | Status: DC

## 2014-04-13 NOTE — Patient Instructions (Signed)
1.  Recommend Tylenol every six hours as needed for pain. 2.  Recommend Tramadol for moderate to severe pain.   3. Take Meloxicam once daily.

## 2014-04-13 NOTE — Progress Notes (Signed)
Subjective:    Patient ID: Ashlee Cochran, female    DOB: [birth year], 59 y.o.   MRN: [id number]  04/13/2014  Joint Pain and Fatigue   HPI This 59 y.o. female presents for evaluation of persistent arthralgias and joint pain.  Evaluated in 12/2013; autoimmune labs negative; referred to rheumatology.  S/p rheumatology evaluation in 01/2014 by Dr. Lenna Gilford.  Had a good spell in 01/2014 when underwent evaluation by rheumatology.  Has follow-up this week with rheum due to acutely worsening arthralgias.  L thumb swelling; B feet hurt; B shoulders hurt.  Something is not right.  Irritable and cranky and crying.  Angry; short tempered; irritable. Hurting everyday.  Pain is keeping up some nights.  Moves a lot during sleep; elbows and shoulders hurt.  Did yard work yesterday but no excessive work; today extremely stiff and sore.  Not exercising regularly because of joint pains.  MTPs of R 1st-3rd toes are painful but no associated swelling or redness. R longitudinal arch hurts intermittently for past year.  B knees hurt.  Decrease in flexibility.  Was taking Ibuprofen but stopped due to stomach upset. Does better on weekends when control of mobility.  Has been on the road for eight weeks; lots of airplane and car sitting; realizes this lifestyle has contributed to worsening symptoms.  Loves water sports but realizes unable to participate in these this summer.  Very frustrated.  Worried about false negative Lyme's titers. Had several tick bites last summer.  Underwent Lyme's titers at [facility] in 12/2013; then rheumatology repeated Lyme's testing in 01/2014.   Review of Systems  Constitutional: Positive for fatigue. Negative for fever, chills and diaphoresis.  Eyes: Negative for visual disturbance.  Respiratory: Negative for cough and shortness of breath.   Cardiovascular: Negative for chest pain, palpitations and leg swelling.  Gastrointestinal: Negative for nausea, vomiting, abdominal pain, diarrhea and  constipation.  Endocrine: Negative for cold intolerance, heat intolerance, polydipsia, polyphagia and polyuria.  Musculoskeletal: Positive for back pain, joint swelling, arthralgias, gait problem, neck pain and neck stiffness. Negative for myalgias.  Skin: Negative for rash.  Neurological: Negative for dizziness, tremors, seizures, syncope, facial asymmetry, speech difficulty, weakness, light-headedness, numbness and headaches.  Psychiatric/Behavioral: Positive for sleep disturbance, dysphoric mood and agitation. Negative for suicidal ideas and self-injury. The patient is nervous/anxious.     Past Medical History  Diagnosis Date  . Allergy   . Arthralgia    Past Surgical History  Procedure Laterality Date  . Ectopic pregnancy surgery    . Tonsillectomy     Allergies  Allergen Reactions  . Codeine Nausea And Vomiting   History   Social History  . Marital Status: Widowed    Spouse Name: n/a  . Number of Children: 1  . Years of Education: 20   Occupational History  . [employer]   Social History Main Topics  . Smoking status: Former Smoker    Quit date: 05/11/2002  . Smokeless tobacco: Never Used  . Alcohol Use: No  . Drug Use: No  . Sexual Activity: No   Other Topics Concern  . Not on file   Social History Narrative   Education: College, Law Degree, Master's in Conflict Studies and Dispute Resolution.   Lives alone, daughter is in college at [employer].   Family History  Problem Relation Age of Onset  . Cancer Mother     mouth (+ tobacco)  . Cancer Father  laryngeal (+tobacco)        Objective:    BP 132/83 mmHg  Pulse 65  Temp(Src) 97.6 F (36.4 C)  Resp 16  Ht 5' 6.5" (1.689 m)  Wt 174 lb (78.926 kg)  BMI 27.67 kg/m2  SpO2 97%  LMP 02/15/2013 Physical Exam  Constitutional: She is oriented to person, place, and time. She appears well-developed and well-nourished. No distress.  HENT:  Head:  Normocephalic and atraumatic.  Right Ear: External ear normal.  Left Ear: External ear normal.  Nose: Nose normal.  Mouth/Throat: Oropharynx is clear and moist.  Eyes: Conjunctivae and EOM are normal. Pupils are equal, round, and reactive to light.  Neck: Normal range of motion. Neck supple. Carotid bruit is not present. No thyromegaly present.  Cardiovascular: Normal rate, regular rhythm, normal heart sounds and intact distal pulses.  Exam reveals no gallop and no friction rub.   No murmur heard. Pulmonary/Chest: Effort normal and breath sounds normal. She has no wheezes. She has no rales.  Abdominal: Soft. Bowel sounds are normal. She exhibits no distension and no mass. There is no tenderness. There is no rebound and no guarding.  Musculoskeletal:       Right hand: She exhibits decreased range of motion, tenderness and swelling. Normal sensation noted. Decreased strength noted.       Hands: R thumb at MCP joint with swelling and erythema and slight warmth;+decreased ROM and flexion. DIPs of B hands diffusely with OA changes and nodularity.  Lymphadenopathy:    She has no cervical adenopathy.  Neurological: She is alert and oriented to person, place, and time. No cranial nerve deficit.  Skin: Skin is warm and dry. No rash noted. She is not diaphoretic. No erythema. No pallor.  Psychiatric: She has a normal mood and affect. Her behavior is normal.   Results for orders placed or performed in visit on 12/03/13  CBC with Differential  Result Value Ref Range   WBC 7.6 4.0 - 10.5 K/uL   RBC 4.91 3.87 - 5.11 MIL/uL   Hemoglobin 15.3 (H) 12.0 - 15.0 g/dL   HCT 44.7 36.0 - 46.0 %   MCV 91.0 78.0 - 100.0 fL   MCH 31.2 26.0 - 34.0 pg   MCHC 34.2 30.0 - 36.0 g/dL   RDW 12.8 11.5 - 15.5 %   Platelets 253 150 - 400 K/uL   MPV 10.6 9.4 - 12.4 fL   Neutrophils Relative % 63 43 - 77 %   Neutro Abs 4.8 1.7 - 7.7 K/uL   Lymphocytes Relative 28 12 - 46 %   Lymphs Abs 2.1 0.7 - 4.0 K/uL    Monocytes Relative 7 3 - 12 %   Monocytes Absolute 0.5 0.1 - 1.0 K/uL   Eosinophils Relative 1 0 - 5 %   Eosinophils Absolute 0.1 0.0 - 0.7 K/uL   Basophils Relative 1 0 - 1 %   Basophils Absolute 0.1 0.0 - 0.1 K/uL   Smear Review Criteria for review not met   Rheumatoid factor  Result Value Ref Range   Rhuematoid fact SerPl-aCnc <10 <=14 IU/mL  ANA  Result Value Ref Range   Anit Nuclear Antibody(ANA) NEG NEGATIVE  Sedimentation rate  Result Value Ref Range   Sed Rate 7 0 - 22 mm/hr  B. burgdorfi antibodies  Result Value Ref Range   B burgdorferi Ab IgG+IgM 0.74 ISR  Comprehensive metabolic panel  Result Value Ref Range   Sodium 139 135 - 145 mEq/L   Potassium  4.2 3.5 - 5.3 mEq/L   Chloride 102 96 - 112 mEq/L   CO2 28 19 - 32 mEq/L   Glucose, Bld 95 70 - 99 mg/dL   BUN 17 6 - 23 mg/dL   Creat 0.82 0.50 - 1.10 mg/dL   Total Bilirubin 0.4 0.2 - 1.2 mg/dL   Alkaline Phosphatase 63 39 - 117 U/L   AST 18 0 - 37 U/L   ALT 22 0 - 35 U/L   Total Protein 7.1 6.0 - 8.3 g/dL   Albumin 4.4 3.5 - 5.2 g/dL   Calcium 9.7 8.4 - 10.5 mg/dL  TSH  Result Value Ref Range   TSH 0.905 0.350 - 4.500 uIU/mL  CK  Result Value Ref Range   Total CK 56 7 - 177 U/L       Assessment & Plan:   1. Arthralgia   2. Generalized anxiety disorder     1. Arthralgias: Persistent and worsening; rx for Mobic provided; recommend Tylenol PRN.  rx for Tramadol provided for moderate pain.  Has appointment with rheumatology in 48 hours. 2.  Generalized anxiety disorder: New.  Secondary to chronic pain and unknown etiology to pain; rx for Cymbalta 40m daily provided; pt reluctant to start at this time yet willing to take rx. If starts, recommend follow-up in two months after starting. RTC if anxiety and depression worsens.     Meds ordered this encounter  Medications  . meloxicam (MOBIC) 15 MG tablet    Sig: Take 1 tablet (15 mg total) by mouth daily.    Dispense:  30 tablet    Refill:  2  . traMADol  (ULTRAM) 50 MG tablet    Sig: Take 1 tablet (50 mg total) by mouth every 6 (six) hours as needed.    Dispense:  60 tablet    Refill:  2  . DULoxetine (CYMBALTA) 30 MG capsule    Sig: Take 1 capsule (30 mg total) by mouth daily.    Dispense:  30 capsule    Refill:  5    No Follow-up on file.     Konstantina Nachreiner MElayne Guerin M.D. [facility] [address]  [phone]

## 2014-06-04 ENCOUNTER — Encounter: Payer: Self-pay | Admitting: *Deleted

## 2014-07-23 ENCOUNTER — Ambulatory Visit (INDEPENDENT_AMBULATORY_CARE_PROVIDER_SITE_OTHER): Payer: Managed Care, Other (non HMO)

## 2014-07-23 ENCOUNTER — Ambulatory Visit (INDEPENDENT_AMBULATORY_CARE_PROVIDER_SITE_OTHER): Payer: Managed Care, Other (non HMO) | Admitting: Family Medicine

## 2014-07-23 VITALS — BP 112/68 | HR 73 | Temp 98.5°F | Resp 18 | Ht 67.0 in | Wt 171.0 lb

## 2014-07-23 DIAGNOSIS — M25552 Pain in left hip: Secondary | ICD-10-CM

## 2014-07-23 DIAGNOSIS — S7002XA Contusion of left hip, initial encounter: Secondary | ICD-10-CM

## 2014-07-23 DIAGNOSIS — J069 Acute upper respiratory infection, unspecified: Secondary | ICD-10-CM | POA: Diagnosis not present

## 2014-07-23 MED ORDER — BENZONATATE 100 MG PO CAPS: 100.0000 mg | ORAL_CAPSULE | Freq: Three times a day (TID) | ORAL | Status: DC | PRN

## 2014-07-23 NOTE — Progress Notes (Signed)
  Subjective:  Patient ID: SAMIE BARCLIFT, female    DOB: [birth year]  Age: 59 y.o. MRN: [id number]  59 year old lady who was in [place] for a wedding on Saturday. She was dancing. She's felt a sudden shooting pain and pop sensation in her left hip area. She quit dancing and walk around some, but it continued to hurt. Subsequently she developed a large area of ecchymosis in that left hip. She flew back home yesterday which was Wednesday, but was stuck with a long trip due to airline computer issues. She continues to hurt and points right to the left greater trochanter area as the site of most discomfort. To ambulate well.  She also was around some other folks who developed upper respiratory infections. She has gotten a head cold and congestion cough. She does not smoke. She has a temperature of 98.5 but in reviewing the old record her usual temperature is about 97.9 and she says she feels a little feverish.   Objective:   Pleasant alert healthy appearing lady. TMs normal. Throat clear. Neck supple without nodes. Chest clear to auscultation. Heart regular without murmurs. She has a grossly visible large area of ecchymosis on her left hip and thigh, at least 6 or 8 inches wide and 10 inches or more long. Her tenderness is just on the posterior aspect of the greater trochanter. Range of motion of the leg is satisfactory. Gait is satisfactory.  Assessment & Plan:   Assessment: Pain and spontaneous ecchymosis left hip, probably secondary to a torn muscle bundle Upper respiratory infection  Plan: Treat the URI symptomatically X-ray left hip to make sure she does not have a bone avulsion from the pop injury  [facility] reading (PRIMARY) by  Dr. Alwyn Ren No fracture or other pathology noted  Treat with ice and time..      Patient Instructions  If you find that there is a movement you cannot make because of the injury to the hip area, that might be suspicious for a completely torn ligament, please  return  If you are continuing to hurt a lot over the next week and not improving steadily, contact us for a referral to sports medicine where they could ultrasound it to examine is a little closer.  Take Allegra-D or Claritin-D or Zyrtec-D (loratadine, fexofenadine, or cetirizine) as needed for head congestion  Continue taking the meloxicam for the hip pain and inflammation, 15 mg daily  Take Tylenol 500 mg 2, 3 times daily as needed for mild pain  Benzonatate 1-2 pills 3 times daily as needed for cough. Will not make you drowsy  Return if worse     HOPPER,DAVID, MD 07/23/2014

## 2014-07-23 NOTE — Patient Instructions (Addendum)
If you find that there is a movement you cannot make because of the injury to the hip area, that might be suspicious for a completely torn ligament, please return  If you are continuing to hurt a lot over the next week and not improving steadily, contact us for a referral to sports medicine where they could ultrasound it to examine is a little closer.  Take Allegra-D or Claritin-D or Zyrtec-D (loratadine, fexofenadine, or cetirizine) as needed for head congestion  Continue taking the meloxicam for the hip pain and inflammation, 15 mg daily  Take Tylenol 500 mg 2, 3 times daily as needed for mild pain  Benzonatate 1-2 pills 3 times daily as needed for cough. Will not make you drowsy  Return if worse

## 2014-07-25 ENCOUNTER — Encounter: Payer: Self-pay | Admitting: Family Medicine

## 2014-07-29 ENCOUNTER — Telehealth: Payer: Self-pay

## 2014-07-29 DIAGNOSIS — M25552 Pain in left hip: Secondary | ICD-10-CM

## 2014-07-29 NOTE — Telephone Encounter (Signed)
Can we refer? 

## 2014-07-29 NOTE — Telephone Encounter (Signed)
Patient states her hip has not improved and she wants to be referred to [facility]. Patients call back number is [phone]

## 2014-07-29 NOTE — Telephone Encounter (Signed)
Referral placed.

## 2014-07-30 NOTE — Telephone Encounter (Signed)
Pt.notified

## 2014-08-03 ENCOUNTER — Telehealth: Payer: Self-pay

## 2014-08-03 NOTE — Telephone Encounter (Signed)
Patient is requesting copy of X-Ray for her left Hip from July 23, 2014

## 2014-09-20 ENCOUNTER — Ambulatory Visit (INDEPENDENT_AMBULATORY_CARE_PROVIDER_SITE_OTHER): Payer: Managed Care, Other (non HMO) | Admitting: Emergency Medicine

## 2014-09-20 VITALS — BP 128/82 | HR 65 | Temp 98.2°F | Resp 16 | Ht 66.5 in | Wt 167.0 lb

## 2014-09-20 DIAGNOSIS — Z0000 Encounter for general adult medical examination without abnormal findings: Secondary | ICD-10-CM | POA: Diagnosis not present

## 2014-09-20 LAB — POCT CBC
GRANULOCYTE PERCENT: 53.7 % (ref 37–80)
HCT, POC: 47.5 % (ref 37.7–47.9)
HEMOGLOBIN: 15.4 g/dL (ref 12.2–16.2)
Lymph, poc: 2.2 (ref 0.6–3.4)
MCH: 29.2 pg (ref 27–31.2)
MCHC: 32.5 g/dL (ref 31.8–35.4)
MCV: 90 fL (ref 80–97)
MID (cbc): 0.4 (ref 0–0.9)
MPV: 7.9 fL (ref 0–99.8)
POC Granulocyte: 3.1 (ref 2–6.9)
POC LYMPH PERCENT: 39.4 %L (ref 10–50)
POC MID %: 6.9 %M (ref 0–12)
Platelet Count, POC: 237 10*3/uL (ref 142–424)
RBC: 5.28 M/uL (ref 4.04–5.48)
RDW, POC: 13.1 %
WBC: 5.7 10*3/uL (ref 4.6–10.2)

## 2014-09-20 LAB — LIPID PANEL
Cholesterol: 309 mg/dL — ABNORMAL HIGH (ref 125–200)
HDL: 63 mg/dL (ref >=46)
LDL Cholesterol: 218 mg/dL — ABNORMAL HIGH (ref <130)
Total CHOL/HDL Ratio: 4.9 Ratio (ref <=5.0)
Triglycerides: 139 mg/dL (ref <150)
VLDL: 28 mg/dL (ref <30)

## 2014-09-20 LAB — POCT GLYCOSYLATED HEMOGLOBIN (HGB A1C): Hemoglobin A1C: 5.7

## 2014-09-20 LAB — GLUCOSE, POCT (MANUAL RESULT ENTRY): POC Glucose: 77 mg/dl (ref 70–99)

## 2014-09-20 NOTE — Progress Notes (Addendum)
This chart was scribed for Lesle Chris, MD by Broadus John, Medical Scribe. This patient was seen in Room 9 and the patient's care was started at 9:00 AM.   Chief Complaint:  Chief Complaint  Patient presents with  . Annual Exam  . declined flu vaccine    HPI: Ashlee Cochran is a 59 y.o. female who reports to [facility] today for a limited scope physical exam for insurance premium.  Pt states that she is not required to have a form filled out. She states that she does not always follow up with an OB/GYN. She indicates that she get a mammogram done about every two years. Pt is not UTD with colonoscopy due to its invasive process.     Past Medical History  Diagnosis Date  . Allergy   . Arthralgia   . Arthritis    Past Surgical History  Procedure Laterality Date  . Ectopic pregnancy surgery    . Tonsillectomy     Social History   Social History  . Marital Status: Widowed    Spouse Name: n/a  . Number of Children: 1  . Years of Education: 20   Occupational History  . Plan Compliance Consultant     [employer]irement [employer]   Social History Main Topics  . Smoking status: Former Smoker    Quit date: 05/11/2002  . Smokeless tobacco: Never Used  . Alcohol Use: No  . Drug Use: No  . Sexual Activity: No   Other Topics Concern  . None   Social History Narrative   Education: Automotive engineer, Holiday representative, Master's in Conflict Studies and Dispute Resolution.   Lives alone, daughter is in college at [employer].   Family History  Problem Relation Age of Onset  . Cancer Mother     mouth (+ tobacco)  . Cancer Father     laryngeal (+tobacco)   Allergies  Allergen Reactions  . Codeine Nausea And Vomiting   Prior to Admission medications   Medication Sig Start Date End Date Taking? Authorizing Provider  cholecalciferol (VITAMIN D) 400 UNITS TABS tablet Take 400 Units by mouth.   Yes Historical Provider, MD  meloxicam (MOBIC) 15 MG tablet Take 1 tablet (15 mg total) by  mouth daily. 04/13/14  Yes Ethelda Chick, MD     ROS: The patient denies fevers, chills, night sweats, unintentional weight loss, chest pain, palpitations, wheezing, dyspnea on exertion, nausea, vomiting, abdominal pain, dysuria, hematuria, melena, numbness, weakness, or tingling  All other systems have been reviewed and were otherwise negative with the exception of those mentioned in the HPI and as above.    PHYSICAL EXAM: Filed Vitals:   09/20/14 0847  BP: 128/82  Pulse: 65  Temp: 98.2 F (36.8 C)  Resp: 16   Body mass index is 26.55 kg/(m^2).   General: Alert, no acute distress HEENT:  Normocephalic, atraumatic, oropharynx patent. Eye: Nonie Hoyer [facility] Cardiovascular:  Regular rate and rhythm, no rubs murmurs or gallops.  No Carotid bruits, radial pulse intact. No pedal edema.  Respiratory: Clear to auscultation bilaterally.  No wheezes, rales, or rhonchi.  No cyanosis, no use of accessory musculature Abdominal: No organomegaly, abdomen is soft and non-tender, positive bowel sounds.  No masses. Musculoskeletal: Gait intact. No edema, tenderness Skin: No rashes. Neurologic: Facial musculature symmetric. Psychiatric: Patient acts appropriately throughout our interaction. Lymphatic: No cervical or submandibular lymphadenopathy Genitourinary/Anorectal: No acute findings    LABS: Results for orders placed or performed in visit on 09/20/14  POCT CBC  Result Value Ref Range   WBC 5.7 4.6 - 10.2 K/uL   Lymph, poc 2.2 0.6 - 3.4   POC LYMPH PERCENT 39.4 10 - 50 %L   MID (cbc) 0.4 0 - 0.9   POC MID % 6.9 0 - 12 %M   POC Granulocyte 3.1 2 - 6.9   Granulocyte percent 53.7 37 - 80 %G   RBC 5.28 4.04 - 5.48 M/uL   Hemoglobin 15.4 12.2 - 16.2 g/dL   HCT, POC 30.8 65.7 - 47.9 %   MCV 90.0 80 - 97 fL   MCH, POC 29.2 27 - 31.2 pg   MCHC 32.5 31.8 - 35.4 g/dL   RDW, POC 84.6 %   Platelet Count, POC 237 142 - 424 K/uL   MPV 7.9 0 - 99.8 fL  POCT glucose (manual entry)  Result Value  Ref Range   POC Glucose 77 70 - 99 mg/dl  POCT glycosylated hemoglobin (Hb A1C)  Result Value Ref Range   Hemoglobin A1C 5.7      EKG/XRAY:   Primary read interpreted by Dr. Cleta Alberts at [facility].   ASSESSMENT/PLAN: Patient declines flu shot. She states she does not see her GYN doctor regular I encouraged her to do that as well as having her mammograms. She declines having a colonoscopy. I will give her information regarding cologuard. She really was only interested in obtaining the blood work needed for allowing her to get a decrease in her insurance premiums.  By signing my name below, I, Rawaa Al Rifaie, attest that this documentation has been prepared under the direction and in the presence of Lesle Chris, MD.  Broadus John, Medical Scribe. 09/20/2014.  9:06 AM.  I personally performed the services described in this documentation, which was scribed in my presence. The recorded information has been reviewed and is accurate.   Gross sideeffects, risk and benefits, and alternatives of medications d/w patient. Patient is aware that all medications have potential sideeffects and we are unable to predict every sideeffect or drug-drug interaction that may occur.  Lesle Chris MD 09/20/2014 9:00 AM

## 2014-10-01 ENCOUNTER — Telehealth: Payer: Self-pay | Admitting: Family Medicine

## 2014-10-01 NOTE — Telephone Encounter (Signed)
Left a message for patient to return call to schedule a mammogram. 

## 2015-11-14 IMAGING — CR DG HUMERUS 2V *L*
2 series · 2 of 2 positions shown · non-contrast
Comparison: None

CLINICAL DATA: Shoulder pain region post fall

EXAM:
LEFT HUMERUS - 2+ VIEW

[lateral]
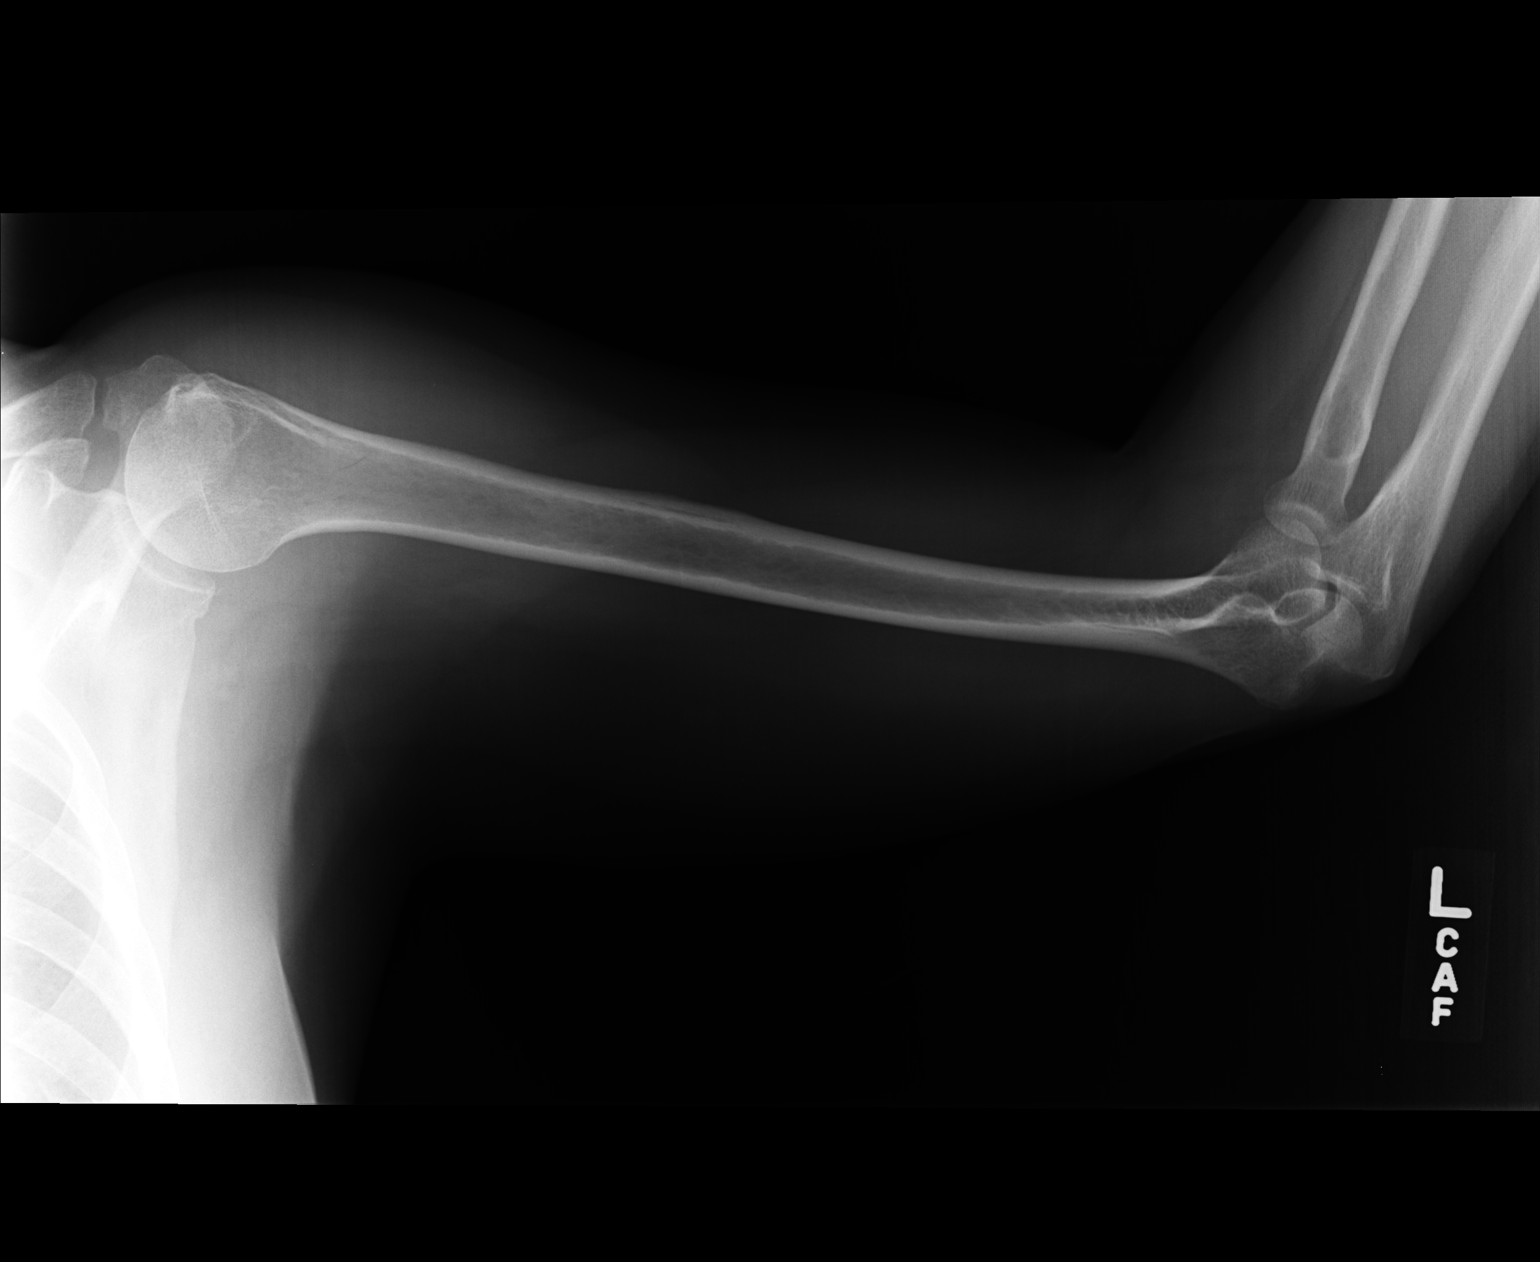

[AP]
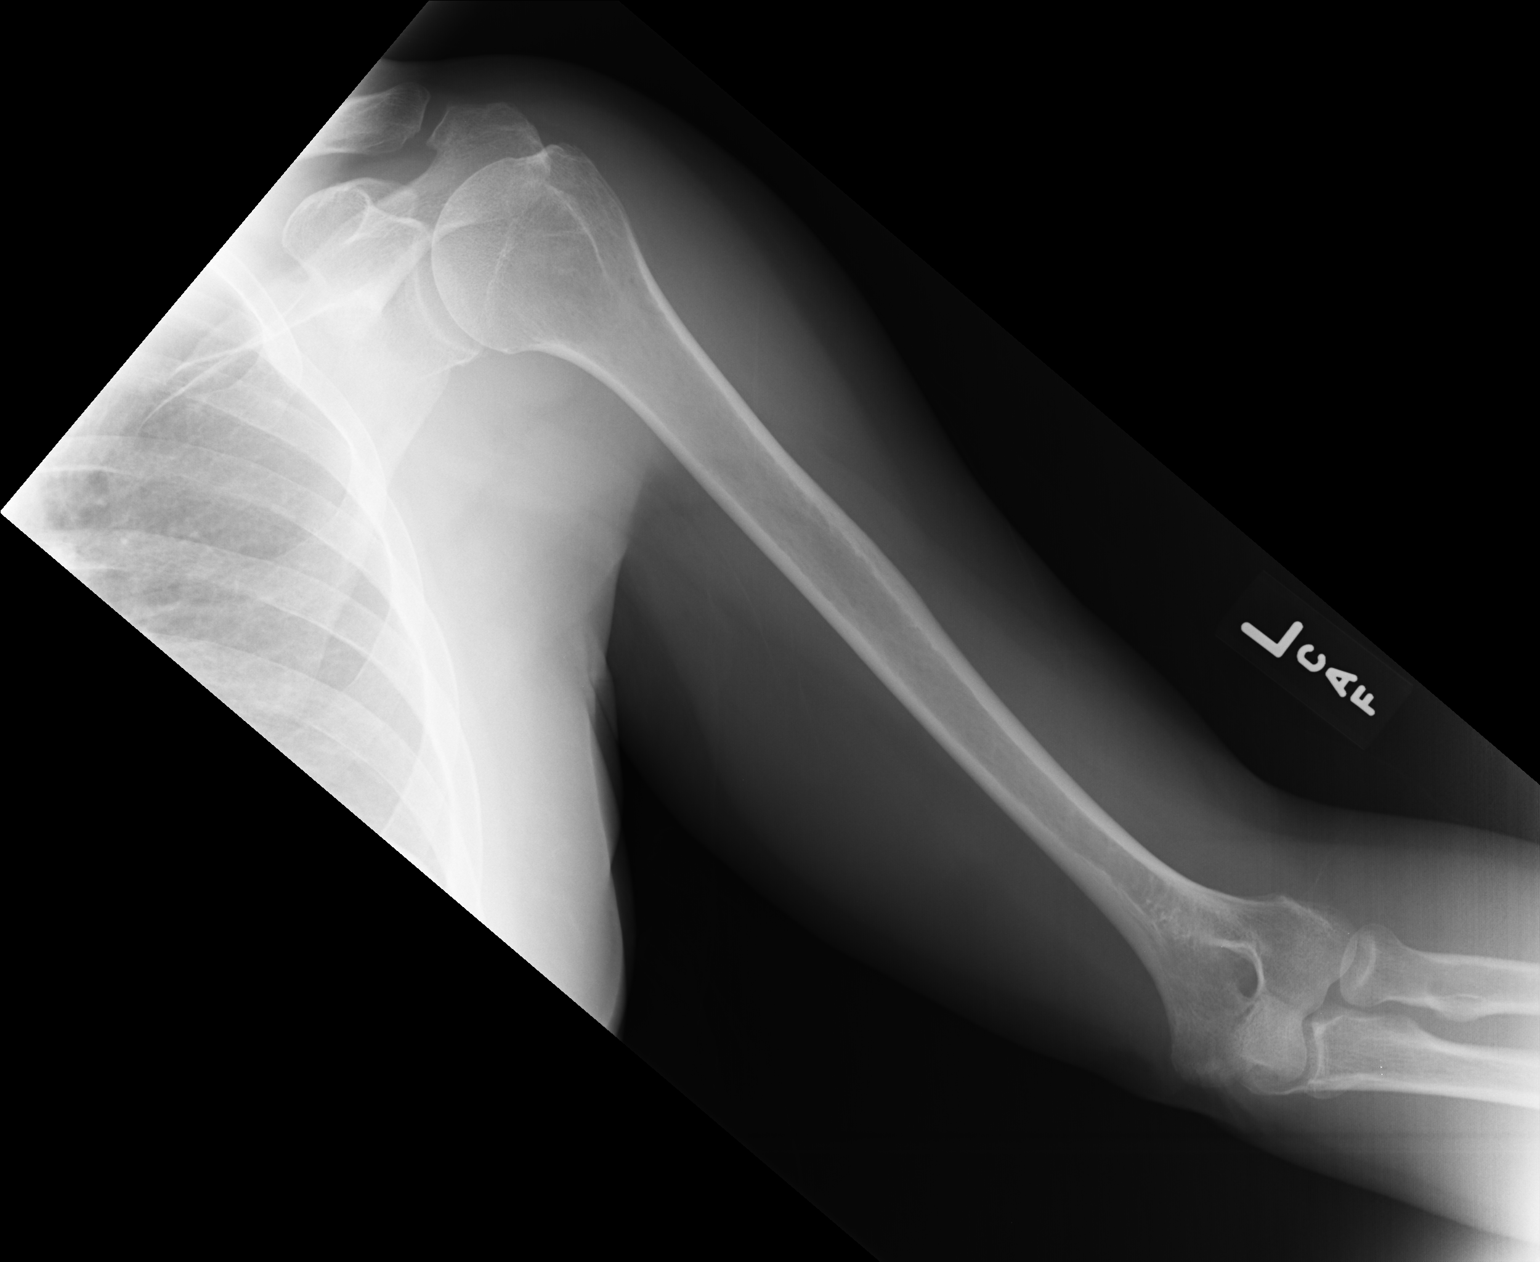

[2 of 2 positions shown; findings below may reference images not displayed]

FINDINGS: Linear lucency identified at the proximal humeral metaphysis
suspicious for a nondisplaced fracture.

Left AC joint alignment normal.

Glenohumeral alignment grossly normal for technique.

No additional fracture, dislocation or bone destruction.

Visualized left ribs intact.
IMPRESSION: Probable nondisplaced oblique fracture of the proximal left humeral
metaphysis.

## 2017-04-09 ENCOUNTER — Encounter: Payer: Self-pay | Admitting: Physician Assistant

## 2017-06-08 IMAGING — CR DG HIP (WITH OR WITHOUT PELVIS) 2-3V*L*
2 series · 2 of 2 positions shown · non-contrast
Comparison: None.

CLINICAL DATA: Pt states she was dancing 6 days ago and felt a pop
in her left hip; no prior injury.

EXAM:
DG HIP (WITH OR WITHOUT PELVIS) 2-3V LEFT

[AP]
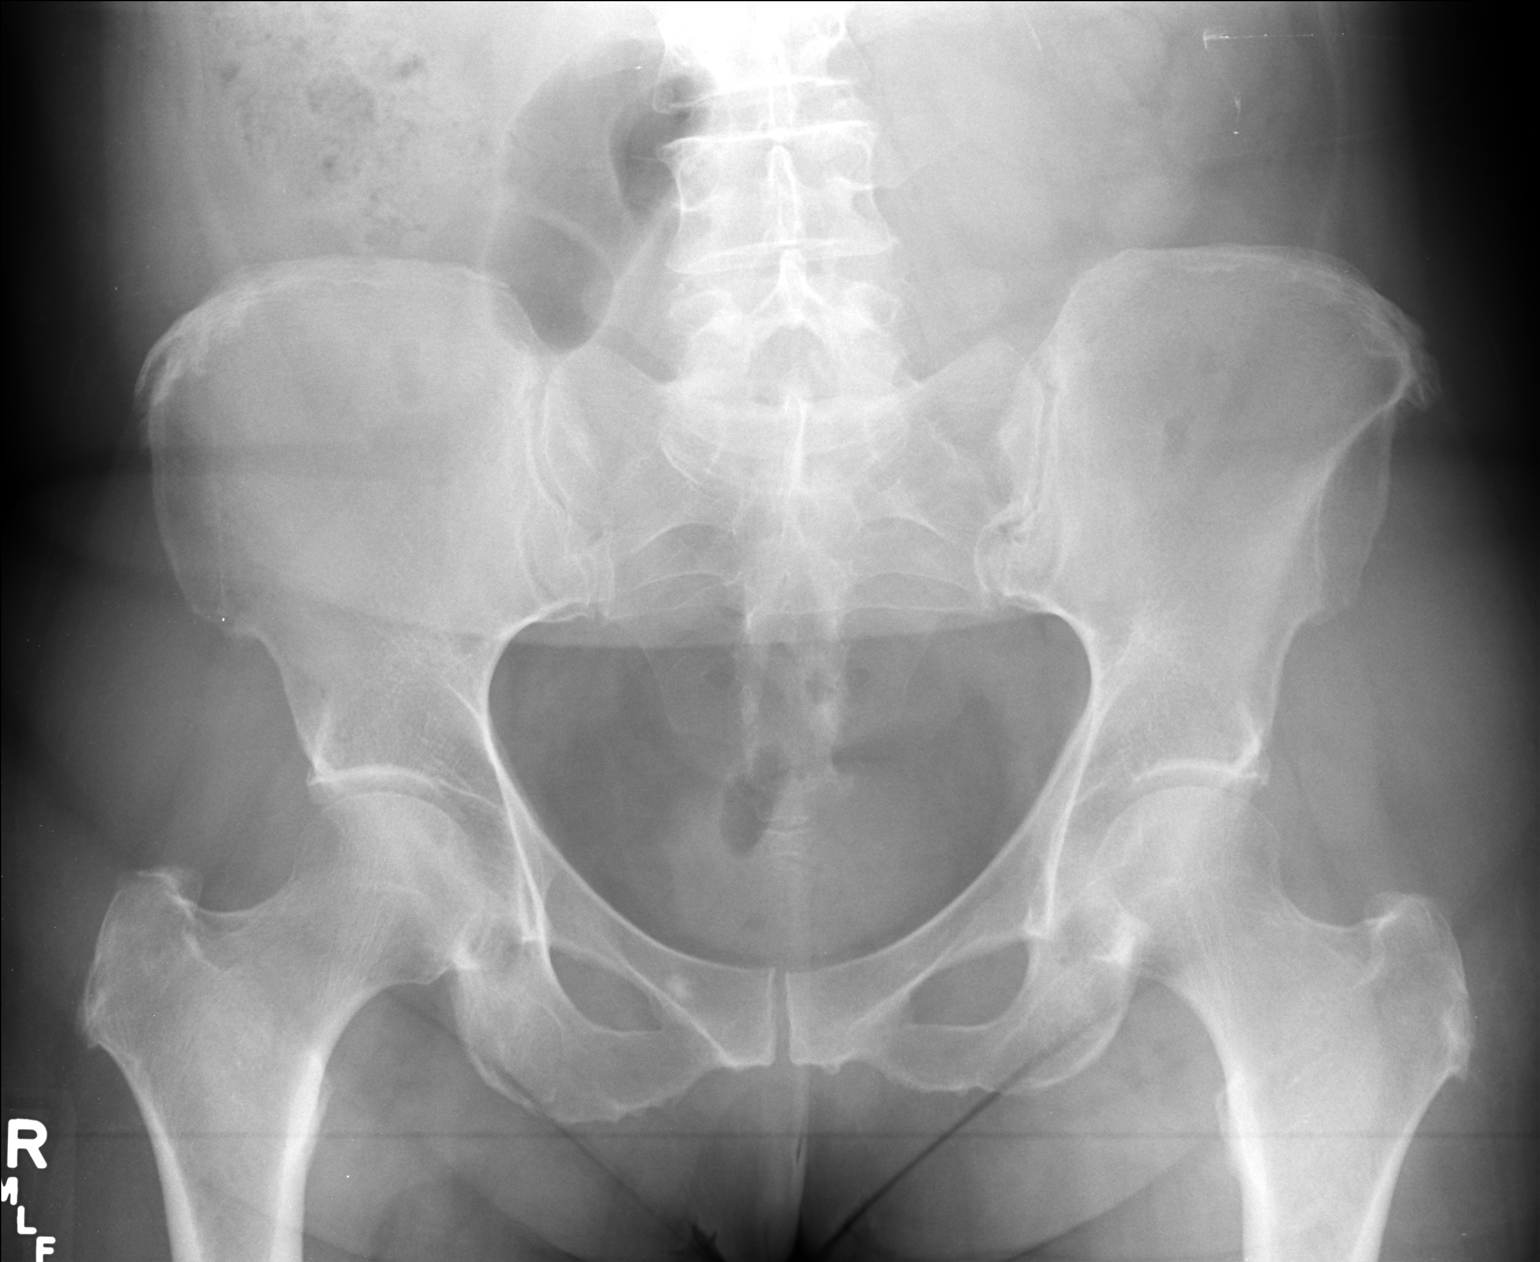

[lateral]
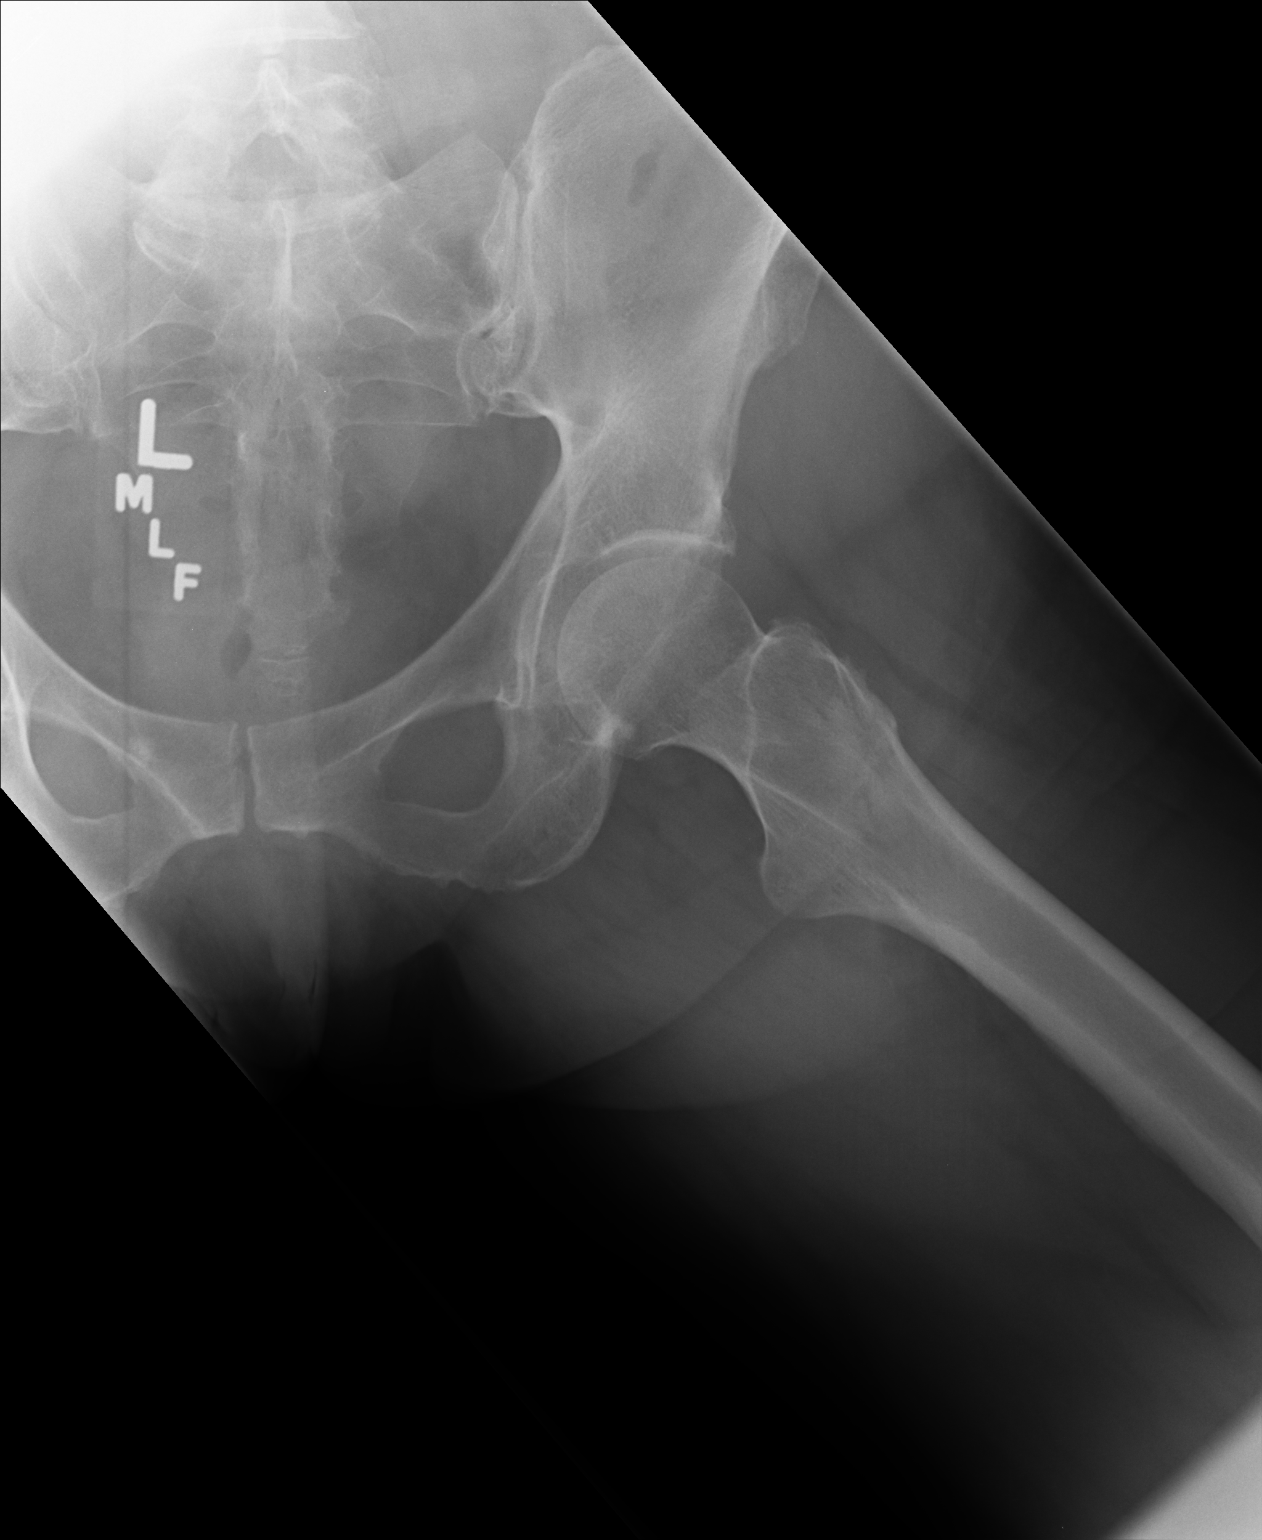

[2 of 2 positions shown; findings below may reference images not displayed]

FINDINGS: There is no evidence of hip fracture or dislocation. There is no
evidence of arthropathy or other focal bone abnormality.
IMPRESSION: Negative.
